# Patient Record
Sex: Female | Born: 1990 | Race: Black or African American | Hispanic: No | Marital: Single | Smoking: Never smoker
Health system: Southern US, Community
[De-identification: ages and names within clinical notes are randomized; demographics above are authoritative.]

## PROBLEM LIST (undated history)

## (undated) DIAGNOSIS — Z9889 Other specified postprocedural states: Secondary | ICD-10-CM

## (undated) DIAGNOSIS — F419 Anxiety disorder, unspecified: Secondary | ICD-10-CM

## (undated) DIAGNOSIS — A6000 Herpesviral infection of urogenital system, unspecified: Secondary | ICD-10-CM

## (undated) DIAGNOSIS — Z973 Presence of spectacles and contact lenses: Secondary | ICD-10-CM

## (undated) DIAGNOSIS — R112 Nausea with vomiting, unspecified: Secondary | ICD-10-CM

## (undated) DIAGNOSIS — D259 Leiomyoma of uterus, unspecified: Secondary | ICD-10-CM

## (undated) HISTORY — PX: TYMPANOSTOMY TUBE PLACEMENT: SHX32

## (undated) HISTORY — PX: KNEE ARTHROSCOPY: SHX127

---

## 2018-04-09 ENCOUNTER — Emergency Department
Admission: EM | Admit: 2018-04-09 | Discharge: 2018-04-09 | Disposition: A | Discharge status: Home / Self Care | Payer: Self-pay | Attending: Emergency Medicine | Admitting: Emergency Medicine

## 2018-04-09 DIAGNOSIS — Z880 Allergy status to penicillin: Secondary | ICD-10-CM | POA: Insufficient documentation

## 2018-04-09 DIAGNOSIS — L03031 Cellulitis of right toe: Secondary | ICD-10-CM | POA: Insufficient documentation

## 2018-04-09 DIAGNOSIS — L03032 Cellulitis of left toe: Secondary | ICD-10-CM | POA: Insufficient documentation

## 2018-04-09 DIAGNOSIS — L03039 Cellulitis of unspecified toe: Secondary | ICD-10-CM

## 2018-04-09 MED ORDER — SULFAMETHOXAZOLE-TRIMETHOPRIM 800-160 MG PO TABS
1.00 | ORAL_TABLET | Freq: Two times a day (BID) | ORAL | 0 refills | Status: AC
Start: 2018-04-09 — End: 2018-04-19

## 2018-04-09 MED ORDER — DIPHENHYDRAMINE HCL 25 MG PO TABS
25.00 mg | ORAL_TABLET | Freq: Four times a day (QID) | ORAL | 0 refills | Status: AC | PRN
Start: 2018-04-09 — End: ?

## 2018-04-09 MED ORDER — SULFAMETHOXAZOLE-TRIMETHOPRIM 800-160 MG PO TABS
1.00 | ORAL_TABLET | Freq: Once | ORAL | Status: AC
Start: 2018-04-09 — End: 2018-04-09
  Administered 2018-04-09: 22:00:00 1 via ORAL
  Filled 2018-04-09: qty 1

## 2018-04-09 MED ORDER — IBUPROFEN 600 MG PO TABS
600.00 mg | ORAL_TABLET | Freq: Four times a day (QID) | ORAL | 0 refills | Status: AC | PRN
Start: 2018-04-09 — End: ?

## 2018-04-09 MED ORDER — IBUPROFEN 600 MG PO TABS
600.00 mg | ORAL_TABLET | Freq: Once | ORAL | Status: AC
Start: 2018-04-09 — End: 2018-04-09
  Administered 2018-04-09: 22:00:00 600 mg via ORAL
  Filled 2018-04-09: qty 1

## 2018-04-09 NOTE — Discharge Instructions (Signed)
Cellulitis    You were diagnosed with cellulitis.    This is a bacterial infection of the skin. Symptoms are usually redness, swelling, and warmth in the affected area. Some people get a fever (temperature higher than 100.4F / 38C) with this infection.    Keep the extremity (arm or leg) above your heart level if possible.    Cellulitis is treated with antibiotics. It is also treated by keeping the affected area elevated (up). Sometimes, antibiotics are given intravenously ("IV"). Other infections can be treated with oral (by mouth) medicines.    Redness, swelling, warmth, and fever should start to get better after 2-3 days of treatment. Come back here or go to the nearest Emergency Department or your primary doctor for a re-check as directed.    Return here or go to the nearest Emergency Department in 48 hours. This will be for another examination.    YOU SHOULD SEEK MEDICAL ATTENTION IMMEDIATELY, EITHER HERE OR AT THE NEAREST EMERGENCY DEPARTMENT, IF ANY OF THE FOLLOWING OCCURS:   Redness spreads even with treatment. You can mark the infection area with a pen. This will help watch for improvement or spreading.   Fever (temperature higher than 100.4F / 38C) doesn t go away or gets worse after 2-3 days of antibiotics.   Unusual or increasing pain in the infected area.   Lightheadedness.   Feeling sicker at any time or not getting better as expected.

## 2018-04-09 NOTE — ED Provider Notes (Signed)
Physician/Midlevel provider first contact with patient: 04/09/18 2104           EMERGENCY DEPARTMENT NOTE    Physician/Midlevel provider first contact with patient: 04/09/18 2104         HISTORY OF PRESENT ILLNESS   Historian: Patient  Translator Used: None    27 y.o. female reports she got a pedicure and has notices nailbed pain to b/l great toes and over the last several days began noticing discharge coming from the tip of her nailbed and discomfort w/ walking. Denies f/c.     1. Location of symptoms: toes  2. Onset of symptoms: weeks  3. What was patient doing when symptoms started (Context): nothing  4. Severity: mild  5. Timing: constant  6. Activities that worsen symptoms: walking  7. Activities that improve symptoms: none  8. Quality: sore, pruritic   9. Radiation of symptoms: none  10. Associated signs and Symptoms: abve   11. Are symptoms worsening? y          MEDICAL HISTORY     Past Medical History:  History reviewed. No pertinent past medical history.    Past Surgical History:  Past Surgical History:   Procedure Laterality Date   . tubes in ears removed         Social History:  Social History     Socioeconomic History   . Marital status: Single     Spouse name: Not on file   . Number of children: Not on file   . Years of education: Not on file   . Highest education level: Not on file   Occupational History   . Not on file   Social Needs   . Financial resource strain: Not on file   . Food insecurity:     Worry: Not on file     Inability: Not on file   . Transportation needs:     Medical: Not on file     Non-medical: Not on file   Tobacco Use   . Smoking status: Never Smoker   . Smokeless tobacco: Never Used   Substance and Sexual Activity   . Alcohol use: Never     Frequency: Never   . Drug use: Yes     Comment: marijuana   . Sexual activity: Not on file   Lifestyle   . Physical activity:     Days per week: Not on file     Minutes per session: Not on file   . Stress: Not on file   Relationships   .  Social connections:     Talks on phone: Not on file     Gets together: Not on file     Attends religious service: Not on file     Active member of club or organization: Not on file     Attends meetings of clubs or organizations: Not on file     Relationship status: Not on file   . Intimate partner violence:     Fear of current or ex partner: Not on file     Emotionally abused: Not on file     Physically abused: Not on file     Forced sexual activity: Not on file   Other Topics Concern   . Not on file   Social History Narrative   . Not on file       Family History:  History reviewed. No pertinent family history.    Outpatient Medication:  Previous Medications  No medications on file       Allergies:  Allergies   Allergen Reactions   . Penicillins          REVIEW OF SYSTEMS   Review of Systems   Constitutional: Negative for chills and fever.   Skin:        Nailbed itch, pain, discharge   All other systems reviewed and are negative.        PHYSICAL EXAM     Vitals:    04/09/18 2103   BP: 122/75   Pulse: 90   Resp: 17   Temp: 98.5 F (36.9 C)   TempSrc: Oral   SpO2: 100%   Weight: 76.2 kg   Height: 5\' 5"  (1.651 m)         Nursing note and vitals reviewed.  Constitutional:  Well developed, well nourished. Awake & Oriented x3. No distress  Head:  Atraumatic. Normocephalic.    Eyes:  Conjunctivae are not pale.  ENT:  Mucous membranes are moist and intact.Patent airway.  Neck:  Supple. Full ROM.    Cardiovascular:  Regular rate. Regular rhythm. No murmurs, rubs, or gallops.  Pulmonary/Chest:  No evidence of respiratory distress. Clear to auscultation bilaterally.  No wheezing, rales or rhonchi.   Extremities:  No edema. No cyanosis. No clubbing. Full range of motion in all extremities.  Skin:  Skin is warm and dry.  No diaphoresis. No rash. B/l great toes with distal nailbed lifted, scant dried discharge noted beneath nailbed with mild ttp. No nailbed erythema, induration/fluctuance. No toe swelling or  discoloration/deformity. Pulses 2+ normal LE sensation   Neurological:  Alert, awake, and appropriate. Normal speech. Motor normal.  Psychiatric:  Good eye contact. Normal interaction, affect, and behavior.        MEDICAL DECISION MAKING       Patient reports purulence, pain from b/l great toe nailbed region. Started on antibiotics. Reports allergy to PCN. rx bactrim given, advised on warm soaks, motrin prn pain, benadryl prn itch. No signs of tinea pedis or paronychia. Advised on f/u with podiatry and primary care clinic    DISCUSSION      Vital Signs: Reviewed the patient?s vital signs.   Nursing Notes: Reviewed and utilized available nursing notes.  Medical Records Reviewed: Reviewed available past medical records.  Counseling: The emergency provider has spoken with the patient and discussed today?s findings, in addition to providing specific details for the plan of care.  Questions are answered and there is agreement with the plan.    IMAGING STUDIES    The following imaging studies were independently interpreted by the Emergency Medicine Physician.  For full imaging study results please see chart.    CARDIAC STUDIES     The following cardiac studies were independently interpreted by the Emergency Medicine Physician. For full cardiac study results please see chart       PULSE OXIMETRY    Oxygen Saturation by Pulse Oximetry: 100%  Interventions:   Interpretation: normal    EMERGENCY DEPT. MEDICATIONS      ED Medication Orders (From admission, onward)    Start Ordered     Status Ordering Provider    04/09/18 2129 04/09/18 2128  sulfamethoxazole-trimethoprim (BACTRIM DS,SEPTRA DS) 800-160 MG per tablet 1 tablet  Once     Route: Oral  Ordered Dose: 1 tablet     Last MAR action:  Given Raliegh Ip    04/09/18 2129 04/09/18 2128  ibuprofen (ADVIL,MOTRIN) tablet 600 mg  Once  Route: Oral  Ordered Dose: 600 mg     Last MAR action:  Given Kayelynn Abdou YVONNE          LABORATORY RESULTS    Ordered and  independently interpreted AVAILABLE laboratory tests. Please see results section in chart for full details.  No results found for this or any previous visit.    CONSULTATIONS        CRITICAL CARE        ATTESTATIONS        Physician Attestation: I, Dr Marnee Spring DO, have been the primary provider for Kelli Wall during this Emergency Dept visit and have reviewed the chart for accuracy and agree with its content.       This note was generated by the Epic EMR system/ Dragon speech recognition and may contain inherent errors or omissions not intended by the user. Grammatical errors, random word insertions, deletions and pronoun errors  are occasional consequences of this technology due to software limitations. Not all errors are caught or corrected. If there are questions or concerns about the content of this note or information contained within the body of this dictation they should be addressed directly with the author for clarification.      DIAGNOSIS      Diagnosis:  Final diagnoses:   Infected nailbed of toe, unspecified laterality       Disposition:  ED Disposition     ED Disposition Condition Date/Time Comment    Discharge  Mon Apr 09, 2018  9:29 PM Kelli Wall discharge to home/self care.    Condition at disposition: Stable          Prescriptions:  Patient's Medications   New Prescriptions    DIPHENHYDRAMINE (BENADRYL) 25 MG TABLET    Take 1 tablet (25 mg total) by mouth every 6 (six) hours as needed for Itching or Allergies    IBUPROFEN (ADVIL,MOTRIN) 600 MG TABLET    Take 1 tablet (600 mg total) by mouth every 6 (six) hours as needed for Pain    SULFAMETHOXAZOLE-TRIMETHOPRIM (BACTRIM DS,SEPTRA DS) 800-160 MG PER TABLET    Take 1 tablet by mouth 2 (two) times daily for 10 days   Previous Medications    No medications on file   Modified Medications    No medications on file   Discontinued Medications    No medications on file          Raliegh Ip, DO  04/09/18 2137

## 2018-04-09 NOTE — ED Triage Notes (Signed)
27yo F presents to ED for bilateral 1st toe pain/possible infection.  Pt was at the nail salon getting a pedicure last Friday and symptoms of puss/swelling/discoloration started after.  Pt can ambulate.

## 2020-06-16 HISTORY — PX: KNEE ARTHROSCOPY: SHX127

## 2021-09-24 ENCOUNTER — Other Ambulatory Visit: Payer: Self-pay

## 2021-09-24 ENCOUNTER — Encounter (HOSPITAL_COMMUNITY): Payer: Self-pay | Admitting: Obstetrics and Gynecology

## 2021-09-24 ENCOUNTER — Inpatient Hospital Stay (HOSPITAL_COMMUNITY)
Admission: EM | Admit: 2021-09-24 | Discharge: 2021-09-24 | Disposition: A | Discharge status: Home / Self Care | Payer: Managed Care, Other (non HMO) | Attending: Obstetrics and Gynecology | Admitting: Obstetrics and Gynecology

## 2021-09-24 ENCOUNTER — Inpatient Hospital Stay (HOSPITAL_BASED_OUTPATIENT_CLINIC_OR_DEPARTMENT_OTHER): Payer: Managed Care, Other (non HMO)

## 2021-09-24 DIAGNOSIS — B9689 Other specified bacterial agents as the cause of diseases classified elsewhere: Secondary | ICD-10-CM

## 2021-09-24 DIAGNOSIS — Z3A15 15 weeks gestation of pregnancy: Secondary | ICD-10-CM

## 2021-09-24 DIAGNOSIS — O99891 Other specified diseases and conditions complicating pregnancy: Secondary | ICD-10-CM | POA: Diagnosis not present

## 2021-09-24 DIAGNOSIS — D259 Leiomyoma of uterus, unspecified: Secondary | ICD-10-CM

## 2021-09-24 DIAGNOSIS — O4442 Low lying placenta NOS or without hemorrhage, second trimester: Secondary | ICD-10-CM | POA: Diagnosis not present

## 2021-09-24 DIAGNOSIS — O3412 Maternal care for benign tumor of corpus uteri, second trimester: Secondary | ICD-10-CM | POA: Diagnosis not present

## 2021-09-24 DIAGNOSIS — O4440 Low lying placenta NOS or without hemorrhage, unspecified trimester: Secondary | ICD-10-CM | POA: Diagnosis not present

## 2021-09-24 LAB — URINALYSIS, ROUTINE W REFLEX MICROSCOPIC
Bilirubin Urine: NEGATIVE
Glucose, UA: NEGATIVE mg/dL
Hgb urine dipstick: NEGATIVE
Ketones, ur: 80 mg/dL — AB
Nitrite: NEGATIVE
Protein, ur: NEGATIVE mg/dL
Specific Gravity, Urine: 1.019 (ref 1.005–1.030)
pH: 6 (ref 5.0–8.0)

## 2021-09-24 LAB — CBC
HCT: 34.4 % — ABNORMAL LOW (ref 36.0–46.0)
Hemoglobin: 11.9 g/dL — ABNORMAL LOW (ref 12.0–15.0)
MCH: 31.2 pg (ref 26.0–34.0)
MCHC: 34.6 g/dL (ref 30.0–36.0)
MCV: 90.1 fL (ref 80.0–100.0)
Platelets: 267 10*3/uL (ref 150–400)
RBC: 3.82 MIL/uL — ABNORMAL LOW (ref 3.87–5.11)
RDW: 12.8 % (ref 11.5–15.5)
WBC: 11.3 10*3/uL — ABNORMAL HIGH (ref 4.0–10.5)
nRBC: 0 % (ref 0.0–0.2)

## 2021-09-24 LAB — WET PREP, GENITAL
Sperm: NONE SEEN
Trich, Wet Prep: NONE SEEN
WBC, Wet Prep HPF POC: >=10 — AB (ref <10)
Yeast Wet Prep HPF POC: NONE SEEN

## 2021-09-24 MED ORDER — METRONIDAZOLE 500 MG PO TABS: 500.0000 mg | ORAL_TABLET | Freq: Two times a day (BID) | ORAL | 0 refills | Status: DC

## 2021-09-24 MED ORDER — CYCLOBENZAPRINE HCL 5 MG PO TABS: 5.0000 mg | ORAL_TABLET | Freq: Three times a day (TID) | ORAL | 0 refills | Status: DC | PRN

## 2021-09-24 MED ORDER — CYCLOBENZAPRINE HCL 5 MG PO TABS
5.0000 mg | ORAL_TABLET | Freq: Once | ORAL | Status: AC
  Administered 2021-09-24: 5 mg via ORAL
  Filled 2021-09-24: qty 1

## 2021-09-24 NOTE — MAU Note (Signed)
.  Bonnie Friedman is a 31 y.o. at Unknown here in MAU reporting: she started having sharp pain in her lower abd mostly on the left. Pain is constant now. c/o watery discharge ? ?Onset of complaint: yesterday ?Pain score: 9/10 ?There were no vitals filed for this visit.   ?FHT:169 ?Lab orders placed from triage:  U/A ? ?

## 2021-09-24 NOTE — MAU Provider Note (Addendum)
?History  ?CSN: [id number] ? ?Arrival date and time: 09/24/21 0958 ? ? Event Date/Time  ? First Provider Initiated Contact with Patient 09/24/21 1058   ?  ? ?Chief Complaint  ?Patient presents with  ? Abdominal Pain  ? ?Bonnie Friedman,  31 y.o. G1P0 at 47w6dpresents for  ?complains of left sided constant abdomnial pain that started yesterday while at work. 7/10 pain scale. Pain kept her up and impedes eating and drinking. Pt states shes tried heat and ice without relief. Pt states she has not taken anything for pain. But some relief by lying on opposite side. Some burning with urination following vaginal exam yesterday. She also endorses watery discharge with some odor. Pt has known hx of fibroids. Denies nausea and vomiting. Just transferred care from KCheri Kearnsin MD. [place]'s [place]  ? ?OB History   ? ? Gravida  ?1  ? Para  ?   ? Term  ?   ? Preterm  ?   ? AB  ?   ? Living  ?   ?  ? ? SAB  ?   ? IAB  ?   ? Ectopic  ?   ? Multiple  ?   ? Live Births  ?   ?   ?  ?  ? ? ?No past medical history on file. ? ?Past Surgical History:  ?Procedure Laterality Date  ? KNEE ARTHROSCOPY Left   ? TYMPANOSTOMY TUBE PLACEMENT    ? ? ?No family history on file. ? ?Social History  ? ?Tobacco Use  ? Smoking status: Never  ? Smokeless tobacco: Never  ?Substance Use Topics  ? Alcohol use: Not Currently  ? Drug use: Not Currently  ? ? ?Allergies:  ?Allergies  ?Allergen Reactions  ? Penicillins Itching  ? ? ?Medications Prior to Admission  ?Medication Sig Dispense Refill Last Dose  ? aspirin EC 81 MG tablet Take 81 mg by mouth daily. Swallow whole.     ? Prenatal Vit-Fe Fumarate-FA (PRENATAL MULTIVITAMIN) TABS tablet Take 1 tablet by mouth daily at 12 noon.     ? Probiotic Product (PROBIOTIC-10 PO) Take 1 tablet by mouth.     ? ? ?Review of Systems  ?Constitutional:  Positive for appetite change. Negative for chills, fatigue and fever.  ?Gastrointestinal:  Positive for abdominal pain. Negative for constipation, nausea  and vomiting.  ?Genitourinary:  Positive for dysuria and pelvic pain. Negative for difficulty urinating, flank pain, vaginal bleeding, vaginal discharge and vaginal pain.  ?Musculoskeletal:  Negative for back pain.  ?Physical Exam  ? ?Blood pressure 131/83, pulse (!) 107, temperature 99.5 ?F (37.5 ?C), resp. rate 18, height '5\' 6"'$  (1.676 m), weight 76.7 kg. ? ?Physical Exam ?Vitals and nursing note reviewed. Exam conducted with a chaperone present.  ?Constitutional:   ?   General: She is not in acute distress. ?Cardiovascular:  ?   Rate and Rhythm: Normal rate.  ?Pulmonary:  ?   Effort: Pulmonary effort is normal. No respiratory distress.  ?Abdominal:  ?   Palpations: Abdomen is soft.  ?   Tenderness: There is abdominal tenderness in the right lower quadrant and left lower quadrant. There is guarding. There is no right CVA tenderness or left CVA tenderness.  ?Genitourinary: ?   Vagina: Vaginal discharge and tenderness present.  ?   Uterus: Enlarged.   ?   Comments: Vaginal irritation noted on side walls. Cervix bright red on exam. Uterus appropriate height for 15w gestation.  ?Skin: ?  General: Skin is warm and dry.  ?Neurological:  ?   Mental Status: She is alert and oriented to person, place, and time.  ?Psychiatric:     ?   Mood and Affect: Mood normal.  ? ? ?MAU Course  ?Procedures ?Lab Orders    ?     Wet prep, genital    ?     Culture, OB Urine    ?     Urinalysis, Routine w reflex microscopic Urine, Clean Catch    ?     CBC    ?Results for orders placed or performed during the hospital encounter of 09/24/21 (from the past 24 hour(s))  ?Urinalysis, Routine w reflex microscopic Urine, Clean Catch     Status: Abnormal  ? Collection Time: 09/24/21 10:30 AM  ?Result Value Ref Range  ? Color, Urine YELLOW YELLOW  ? APPearance CLOUDY (A) CLEAR  ? Specific Gravity, Urine 1.019 1.005 - 1.030  ? pH 6.0 5.0 - 8.0  ? Glucose, UA NEGATIVE NEGATIVE mg/dL  ? Hgb urine dipstick NEGATIVE NEGATIVE  ? Bilirubin Urine NEGATIVE  NEGATIVE  ? Ketones, ur 80 (A) NEGATIVE mg/dL  ? Protein, ur NEGATIVE NEGATIVE mg/dL  ? Nitrite NEGATIVE NEGATIVE  ? Leukocytes,Ua MODERATE (A) NEGATIVE  ? RBC / HPF 0-5 0 - 5 RBC/hpf  ? WBC, UA 6-10 0 - 5 WBC/hpf  ? Bacteria, UA MANY (A) NONE SEEN  ? Squamous Epithelial / LPF 11-20 0 - 5  ? Mucus PRESENT   ?CBC     Status: Abnormal  ? Collection Time: 09/24/21 11:32 AM  ?Result Value Ref Range  ? WBC 11.3 (H) 4.0 - 10.5 K/uL  ? RBC 3.82 (L) 3.87 - 5.11 MIL/uL  ? Hemoglobin 11.9 (L) 12.0 - 15.0 g/dL  ? HCT 34.4 (L) 36.0 - 46.0 %  ? MCV 90.1 80.0 - 100.0 fL  ? MCH 31.2 26.0 - 34.0 pg  ? MCHC 34.6 30.0 - 36.0 g/dL  ? RDW 12.8 11.5 - 15.5 %  ? Platelets 267 150 - 400 K/uL  ? nRBC 0.0 0.0 - 0.2 %  ?Wet prep, genital     Status: Abnormal  ? Collection Time: 09/24/21 11:38 AM  ?Result Value Ref Range  ? Yeast Wet Prep HPF POC NONE SEEN NONE SEEN  ? Trich, Wet Prep NONE SEEN NONE SEEN  ? Clue Cells Wet Prep HPF POC PRESENT (A) NONE SEEN  ? WBC, Wet Prep HPF POC >=10 (A) <10  ? Sperm NONE SEEN   ? ?MFM OB Ultrasound ordered.  ?Preliminary results revealed: 1 IUP with FHR: 086 in cephalic position. AFI Normal.   ?Low-lying placenta 1.4cm from cervical os.  ?Multiple uterine fibroids identified: largest 7.5cm located in LUS. (See [place] for complete interpretation)  ? ?MDM ?Based on results, low suspicion for SOL and pyelonephritis.  ? ?Assessment and Plan  ?- Uterine fibroids- Pregnancy related Pain management Expectations discussed with pt. Rx Flexeril '5mg'$  PO sent to outpatient pharmacy. Pt given 1 tab in MAU. ?  ?- +BV- Rx sent to OP pharamacy. ? ?- Low lying placenta- Pelvic rest and Bleeding precautions discussed. CNM reassured pt of possible placental migration. ? ?- Pt discharged in stable condition. ?- Preterm labor precautions reviewed.  ? -Information provided on [facility] and [facility] to establish [facility].   ?    ?Jacquiline Doe, CNM  ?09/24/2021, 10:58 AM  ?

## 2021-09-25 LAB — CULTURE, OB URINE

## 2021-09-27 LAB — GC/CHLAMYDIA PROBE AMP (~~LOC~~) NOT AT ARMC
Chlamydia: NEGATIVE
Comment: NEGATIVE
Comment: NORMAL
Neisseria Gonorrhea: NEGATIVE

## 2021-10-06 LAB — HEPATITIS C ANTIBODY: HCV Ab: NEGATIVE

## 2021-10-06 LAB — OB RESULTS CONSOLE HEPATITIS B SURFACE ANTIGEN: Hepatitis B Surface Ag: NEGATIVE

## 2021-10-06 LAB — OB RESULTS CONSOLE ABO/RH: RH Type: POSITIVE

## 2021-10-06 LAB — OB RESULTS CONSOLE RUBELLA ANTIBODY, IGM: Rubella: IMMUNE

## 2021-10-06 LAB — OB RESULTS CONSOLE HIV ANTIBODY (ROUTINE TESTING): HIV: NONREACTIVE

## 2021-10-06 LAB — OB RESULTS CONSOLE ANTIBODY SCREEN: Antibody Screen: NEGATIVE

## 2021-10-06 LAB — OB RESULTS CONSOLE GC/CHLAMYDIA
Chlamydia: NEGATIVE
Neisseria Gonorrhea: NEGATIVE

## 2021-10-06 LAB — OB RESULTS CONSOLE RPR: RPR: NONREACTIVE

## 2021-11-26 ENCOUNTER — Encounter (HOSPITAL_COMMUNITY): Payer: Self-pay | Admitting: Obstetrics and Gynecology

## 2021-11-26 ENCOUNTER — Inpatient Hospital Stay (HOSPITAL_COMMUNITY)
Admission: AD | Admit: 2021-11-26 | Discharge: 2021-11-26 | Disposition: A | Discharge status: Home / Self Care | Payer: Managed Care, Other (non HMO) | Attending: Obstetrics and Gynecology | Admitting: Obstetrics and Gynecology

## 2021-11-26 ENCOUNTER — Other Ambulatory Visit: Payer: Self-pay

## 2021-11-26 DIAGNOSIS — D259 Leiomyoma of uterus, unspecified: Secondary | ICD-10-CM

## 2021-11-26 DIAGNOSIS — R109 Unspecified abdominal pain: Secondary | ICD-10-CM

## 2021-11-26 DIAGNOSIS — O26892 Other specified pregnancy related conditions, second trimester: Secondary | ICD-10-CM

## 2021-11-26 DIAGNOSIS — Z3689 Encounter for other specified antenatal screening: Secondary | ICD-10-CM

## 2021-11-26 DIAGNOSIS — Z3A25 25 weeks gestation of pregnancy: Secondary | ICD-10-CM | POA: Insufficient documentation

## 2021-11-26 DIAGNOSIS — Z3A24 24 weeks gestation of pregnancy: Secondary | ICD-10-CM

## 2021-11-26 DIAGNOSIS — Z79899 Other long term (current) drug therapy: Secondary | ICD-10-CM | POA: Diagnosis not present

## 2021-11-26 DIAGNOSIS — O3412 Maternal care for benign tumor of corpus uteri, second trimester: Secondary | ICD-10-CM | POA: Insufficient documentation

## 2021-11-26 HISTORY — DX: Leiomyoma of uterus, unspecified: D25.9

## 2021-11-26 LAB — URINALYSIS, ROUTINE W REFLEX MICROSCOPIC
Bilirubin Urine: NEGATIVE
Bilirubin Urine: NEGATIVE
Glucose, UA: NEGATIVE mg/dL
Glucose, UA: NEGATIVE mg/dL
Hgb urine dipstick: NEGATIVE
Hgb urine dipstick: NEGATIVE
Ketones, ur: NEGATIVE mg/dL
Ketones, ur: NEGATIVE mg/dL
Leukocytes,Ua: NEGATIVE
Leukocytes,Ua: NEGATIVE
Nitrite: NEGATIVE
Nitrite: NEGATIVE
Protein, ur: NEGATIVE mg/dL
Protein, ur: NEGATIVE mg/dL
Specific Gravity, Urine: 1.013 (ref 1.005–1.030)
Specific Gravity, Urine: 1.005 (ref 1.005–1.030)
pH: 6 (ref 5.0–8.0)
pH: 7 (ref 5.0–8.0)

## 2021-11-26 LAB — WET PREP, GENITAL
Sperm: NONE SEEN
Trich, Wet Prep: NONE SEEN
WBC, Wet Prep HPF POC: <10 (ref <10)
Yeast Wet Prep HPF POC: NONE SEEN

## 2021-11-26 LAB — FETAL FIBRONECTIN: Fetal Fibronectin: NEGATIVE

## 2021-11-26 MED ORDER — MORPHINE SULFATE (PF) 4 MG/ML IV SOLN
4.0000 mg | Freq: Once | INTRAVENOUS | Status: AC
  Administered 2021-11-26: 4 mg via INTRAVENOUS
  Filled 2021-11-26: qty 1

## 2021-11-26 MED ORDER — LACTATED RINGERS IV BOLUS
1000.0000 mL | Freq: Once | INTRAVENOUS | Status: AC
  Administered 2021-11-26: 1000 mL via INTRAVENOUS

## 2021-11-26 MED ORDER — KETOROLAC TROMETHAMINE 30 MG/ML IJ SOLN
30.0000 mg | Freq: Once | INTRAMUSCULAR | Status: AC
  Administered 2021-11-26: 30 mg via INTRAVENOUS
  Filled 2021-11-26: qty 1

## 2021-11-26 MED ORDER — METRONIDAZOLE 0.75 % VA GEL: 1.0000 | Freq: Every day | VAGINAL | 0 refills | Status: DC

## 2021-11-26 NOTE — MAU Note (Signed)
Abraham Margulies is a 31 y.o. at 17w6dhere in MAU reporting: was here over night due to fibroid pain. Has been Rx multiple pain medications. Was told last night that she was having contractions. Started having pains again around 10 this morning. Took a muscle relaxer and that did help her pain but was still feeling intermittent pain. Was advised to come back for evaluation. Pain is less frequent then before and occurs about 1 time per hour. No bleeding or LOF. +FM  Onset of complaint: ongoing  Pain score: 9/10  Vitals:   11/26/21 1541  BP: 119/79  Pulse: (!) 104  Resp: 16  Temp: 98.3 F (36.8 C)  SpO2: 98%     FHT: +FM, doppler deferred  Lab orders placed from triage: UA

## 2021-11-26 NOTE — MAU Provider Note (Signed)
History    CSN: [id number]  Arrival date and time: 11/26/21 1508  Event Date/Time  First Provider Initiated Contact with Patient 11/26/21 1607     Chief Complaint  Patient presents with   Abdominal Pain   HPI Bonnie Friedman is a 31 y.o. G1P0 at 23w6dwho presents to MAU with concern for preterm labor and abdominal pain 2/2 previously diagnosed uterine fibroids. Patient was discharged from MAU roughly ten hours ago. She states she is returning on advice of Dr. LCorinna Capradue to concern for preterm labor and consistently elevated pain score. Patient provides the following history:  Sunday, onset of abdominal pain Patient's fibroid pain began Sunday 11/21/2021. She states she stayed home from work on Tuesday due to persistent and severe abdominal pain. She [place] vehicle [place]. [place]then just lollygagged around the house" watching television.   Wednesday, outpatient evaluation Patient presented to her OB office for evaluation of her abdominal pain on Wednesday. She was advised to stop Flexeril because it wasn't helping and to initiate Tylenol 1000 mg TID for 24 hours. Patient reports being compliant with this regimen but did not experience relief from her pain.   Thursday, worsening pain Patient states she did not feel relief from Tylenol. She was unable to fall asleep Wednesday night. Per previous discussion with her OB, she changed to Robaxin, progesterone qhs, percocet as needed. She experienced relief with Percocet after 15-20 min, used the improved comfort to lightly clean her kitchen. Her pain restarted  around 11pm Thursday night. Patient states that between 11pm Thursday and 2am Friday she had almost continuous sharp pains. She was advised to present to MAU for evaluation.  Friday MAU Encounter 1 of 2 Patient states she was still in pain when  discharged from MAU early this morning. She was given Morphine shortly before discharge and states she was able to sleep from about 6am-10am but then work up with 10/10 pain. She states she was told her cervix was closed but also told she was having contractions and she is concerned that her baby is about to be born.  Friday MAU Encounter 2 of 2 (current encounter) On arrival to MAU patient denies contractions. She states that when in supine, low fowler's position she does not feel pain. She believes that the Morphine she received in her previous encounter "allowed time for the Robaxin to kick in". Patient reports to CNM that "I only truly rest when the pain is at its worst". She also states that she has been put on bedrest by her OB for the next six days "but I haven't truly been able to rest yet". She denies vaginal bleeding, leaking of fluid, decreased fetal movement, fever, falls, or recent illness.   Patient receives care with [facility].  OB History     Gravida  1   Para      Term      Preterm      AB      Living         SAB      IAB      Ectopic      Multiple      Live Births              Past Medical History:  Diagnosis Date   Uterine fibroid     Past Surgical History:  Procedure Laterality Date   KNEE ARTHROSCOPY Left    TYMPANOSTOMY TUBE PLACEMENT      Family History  Problem Relation Age of Onset   Stroke Father    Multiple sclerosis Father    Diabetes Maternal Grandmother     Social History   Tobacco Use   Smoking status: Never   Smokeless tobacco: Never  Substance Use Topics   Alcohol use: Not Currently   Drug use: Not Currently    Allergies:  Allergies  Allergen Reactions   Penicillins Itching    Medications Prior to Admission  Medication Sig Dispense Refill Last Dose   aspirin EC 81 MG tablet Take 81 mg by mouth daily. Swallow whole.   11/26/2021 at 1200   methocarbamol (ROBAXIN) 750 MG tablet Take 750 mg by mouth 4  (four) times daily.   11/26/2021 at 1420   Prenatal Vit-Fe Fumarate-FA (PRENATAL MULTIVITAMIN) TABS tablet Take 1 tablet by mouth daily at 12 noon.   11/26/2021 at 1200   Probiotic Product (PROBIOTIC-10 PO) Take 1 tablet by mouth.   11/26/2021 at 1200   progesterone (PROMETRIUM) 100 MG capsule Take 100 mg by mouth daily.   11/25/2021   valACYclovir (VALTREX) 500 MG tablet Take 500 mg by mouth 2 (two) times daily.   11/26/2021 at 1200   acetaminophen (TYLENOL) 500 MG tablet Take 1,000 mg by mouth every 6 (six) hours as needed.      cyclobenzaprine (FLEXERIL) 5 MG tablet Take 1 tablet (5 mg total) by mouth 3 (three) times daily as needed for muscle spasms. 20 tablet 0    metroNIDAZOLE (METROGEL VAGINAL) 0.75 % vaginal gel Place 1 Applicatorful vaginally at bedtime. Insert one applicator, at bedtime, for 5 nights. 70 g 0    oxyCODONE-acetaminophen (PERCOCET) 7.5-325 MG tablet Take 1 tablet by mouth every 4 (four) hours as needed for severe pain.       Review of Systems  Gastrointestinal:  Positive for abdominal pain.  Psychiatric/Behavioral:  The patient is nervous/anxious.   All other systems reviewed and are negative.  Physical Exam   Blood pressure 119/79, pulse (!) 104, temperature 98.3 F (36.8 C), temperature source Oral, resp. rate 16, SpO2 98 %.  Physical Exam Vitals and nursing note reviewed. Exam conducted with a chaperone present.  Constitutional:      Appearance: She is well-developed. She is not ill-appearing.  Cardiovascular:     Rate and Rhythm: Normal rate and regular rhythm.     Heart sounds: Normal heart sounds.  Pulmonary:     Effort: Pulmonary effort is normal.     Breath sounds: Normal breath sounds.  Abdominal:     Comments: Gravid  Skin:    Capillary Refill: Capillary refill takes less than 2 seconds.  Neurological:     Mental Status: She is alert and oriented to person, place, and time.     MAU Course  Procedures  MDM --Summary of myomas from  09/27/2021:   Site                     L(cm)      W(cm)      D(cm)       Location  LUS                      7.5        5.4        8.2  Post Left  3.9        3.7        4.08  Rt                       2.9        3.3        2.8  Ant Sag                  2.2        1.3        2  --Reassuring fetal tracing at [redacted]w[redacted]d baseline 150, mod var, + 10 x 10 accels, no decels --Toco: quiet throughout encounter --Initial discussion and assessment of HPI with CNM necessitated 40 min at bedside with patient. Patient accompanied by her mom who is supportive and appropriate throughout.  --Workup and results from previous encounter reviewed, including clinical significance of closed cervix and negative FFN, absence of contractions on monitor during current encounter, patient's stated satisfaction with current regimen --Confirmed with patient that her fibroids were re-imaged Wednesday 11/24/2021. No indication to reimage in MAU. Patient verbalized understanding --Patient verbalizes that she feels comfortable with current management. Denies contractions during current MAU encounter --Plan made with patient to continue outpatient plan currently in place: Robaxin, Progesterone, Percocet PRN, and true bedrest. Discussed with patient that there are other NSAIDs available if she would like to d/c Robaxin. Patient verbalizes that she is happy with current regimen. Summarized with patient and her mom that current plan means current MAU workup will not include additional labs or imaging. Patient verbalizes understanding, restates that she feels safe with that plan --Dr. LCorinna Capracontacted, already aware of patient. Previous workup + current assessment and patient discussion reviewed in detail. Confirmed he is in agreement with maintaining current plan. CNM returned to bedside, shared fruits of discussion with patient and her mother. Patient again offered d/c of Robaxin, move to alternative medication (e.g. Indomethacin).  Patient declines.  Patient Vitals for the past 24 hrs:  BP Temp Temp src Pulse Resp SpO2  11/26/21 1601 115/73 -- -- 98 -- --  11/26/21 1541 119/79 98.3 F (36.8 C) Oral (!) 104 16 98 %   Results for orders placed or performed during the hospital encounter of 11/26/21 (from the past 24 hour(s))  Urinalysis, Routine w reflex microscopic Urine, Clean Catch     Status: Abnormal   Collection Time: 11/26/21  3:39 PM  Result Value Ref Range   Color, Urine STRAW (A) YELLOW   APPearance CLEAR CLEAR   Specific Gravity, Urine 1.005 1.005 - 1.030   pH 7.0 5.0 - 8.0   Glucose, UA NEGATIVE NEGATIVE mg/dL   Hgb urine dipstick NEGATIVE NEGATIVE   Bilirubin Urine NEGATIVE NEGATIVE   Ketones, ur NEGATIVE NEGATIVE mg/dL   Protein, ur NEGATIVE NEGATIVE mg/dL   Nitrite NEGATIVE NEGATIVE   Leukocytes,Ua NEGATIVE NEGATIVE   Assessment and Plan  --31y.o. G1P0 at 264w0d--Reactive tracing --No contractions on toco --Negative FFN 11/26/2021 during previous encounter --Pain score 0/10 throughout MAU encounter --Change in medication regimen declined by patient --Patient to continue current plan for true bedrest until next appointment in office 12/02/2021 --Care coordinated with Dr. LoCorinna Capra-Discharge home in stable condition with preterm labor precautions  F/U: --[facility]8. Patient reports feeling much better, endorses low back pain, no abdominal pain, resting comfortably at home --Next appointment in office is 12/02/2021  SaMallie SnooksMSDetroitMSN, CNM Certified Nurse  Midwife, Product/process development scientist for [employer], [place]

## 2021-11-26 NOTE — MAU Note (Signed)
..  Bonnie Friedman is a 31 y.o. at 65w6dhere in MAU reporting: Sharp abomdinal pain that comes and goes, began on Sunday. Reports she has been to the office multiple times and they told her it was her fibroids and prescribed percocet and muscle relaxer. Took the percocet at 8:30pm and took muscle relaxer at 11pm and the pain went away but is back again.  Denies vaginal bleeding or leaking of fluid. +FM.   Pain score: 10/10  FHT:153 Lab orders placed from triage: UA

## 2021-11-26 NOTE — Discharge Instructions (Signed)
PREGNANCY SUPPORT BELT:   You are not alone, Seventy-five percent of women have some sort of abdominal or back pain at some point in their pregnancy. Your baby is growing at a fast pace, which means that your whole body is rapidly trying to adjust to the changes. As your uterus grows, your back may start feeling a bit under stress and this can result in back or abdominal pain that can go from mild, and therefore bearable, to severe pains that will not allow you to sit or lay down comfortably, When it comes to dealing with pregnancy-related pains and cramps, some pregnant women usually prefer natural remedies, which the market is filled with nowadays. For example, wearing a pregnancy support belt can help ease and lessen your discomfort and pain.  WHAT ARE THE BENEFITS OF WEARING A PREGNANCY SUPPORT BELT? A pregnancy support belt provides support to the lower portion of the belly taking some of the weight of the growing uterus and distributing to the other parts of your body. It is designed make you comfortable and gives you extra support. Over the years, the pregnancy apparel market has been studying the needs and wants of pregnant women and they have come up with the most comfortable pregnancy support belts that woman could ever ask for. In fact, you will no longer have to wear a stretched-out or bulky pregnancy belt that is visible underneath your clothes and makes you feel even more uncomfortable. Nowadays, a pregnancy support belt is made of comfortable and stretchy materials that will not irritate your skin but will actually make you feel at ease and you will not even notice you are wearing it. They are easy to put on and adjust during the day and can be worn at night for additional support.   BENEFITS: Relives Back pain Relieves Abdominal Muscle and Leg Pain Stabilizes the Pelvic Ring Offers a Cushioned Abdominal Lift Pad Relieves pressure on the Sciatic Nerve Within Minutes   Locations that  Carry Maternity/Pregnancy Support Belts  These locations will file your insurance, including [place] [employer]:  [facility] [place], [place] [address] [place] [address] [place], [place] [address] [phone]  [employer] [address] [place], [place] [address] [phone]  [employer] [address] [place],  [address] [phone]  If you have any problems getting the belt, let your office know.

## 2021-11-26 NOTE — MAU Provider Note (Signed)
History     CSN: [id number]  Arrival date and time: 11/26/21 0247   Event Date/Time   First Provider Initiated Contact with Patient 11/26/21 [phone]      Chief Complaint  Patient presents with   Abdominal Pain   Bonnie Friedman is a 31 y.o. G1P0 at 60w6dwho receives care at [facility].  She has had 2 appts this week regarding her abdominal pain s/t known uterine fibroids.   She presents today for Abdominal Pain.  She states the pain started on Sunday after going to the grocery store and cleaning "more than normal" on Saturday.  She states the pain has gotten progressively worse despite multiple meds including Tylenol, muscle relaxants, and percocet. She reports taking Percocet at 2030 and Robaxin at 11pm without relief.  She states the pain occurs in "episodes that is off and on," and describes it as a "sharp pain" that occurs in the lower abdominal area.  Patient reports the pain is improved with walking, but worsened with movement and laying down.  She denies upper abdominal pain.  She states she has also been taking oral progesterone as a means of decreasing risk for PTL and was started on this yesterday.  She was experiencing some N/V on Monday and has had two episodes this evening.  However, she is not currently nauseous.  Patient SO, PHaze Rushing is present and contributes to HPI. He also expresses frustrations with patient's ongoing pain and minimal relief despite medication dosing.   Patient endorses fetal movement and denies vaginal discharge or bleeding. She is unsure if she is experiencing contractions, but states the nurse told her she was and she is concerned.    PNR from [facility] reviewed and reveals appt on 7/14 with [place] 3 notable fibroids: LUS 7.1 x 5.6, Right Lateral 5.5 x 5.4, and Anterior: 5.8 x 5.5 as well as several smaller fibroids.  Cervical Length 4.6.  Provider also suspects and counsels patient on degenerating fibroids and encourages Tylenol '1000mg'$  TID usage  and rest. Patient was scheduled for follow up the next day, but records not available.    OB History     Gravida  1   Para      Term      Preterm      AB      Living         SAB      IAB      Ectopic      Multiple      Live Births              No past medical history on file.  Past Surgical History:  Procedure Laterality Date   KNEE ARTHROSCOPY Left    TYMPANOSTOMY TUBE PLACEMENT      No family history on file.  Social History   Tobacco Use   Smoking status: Never   Smokeless tobacco: Never  Substance Use Topics   Alcohol use: Not Currently   Drug use: Not Currently    Allergies:  Allergies  Allergen Reactions   Penicillins Itching    Medications Prior to Admission  Medication Sig Dispense Refill Last Dose   acetaminophen (TYLENOL) 500 MG tablet Take 1,000 mg by mouth every 6 (six) hours as needed.   11/25/2021   cyclobenzaprine (FLEXERIL) 5 MG tablet Take 1 tablet (5 mg total) by mouth 3 (three) times daily as needed for muscle spasms. 20 tablet 0 Past Week   methocarbamol (ROBAXIN) 750 MG tablet  Take 750 mg by mouth 4 (four) times daily.   11/25/2021   oxyCODONE-acetaminophen (PERCOCET) 7.5-325 MG tablet Take 1 tablet by mouth every 4 (four) hours as needed for severe pain.   11/25/2021   Prenatal Vit-Fe Fumarate-FA (PRENATAL MULTIVITAMIN) TABS tablet Take 1 tablet by mouth daily at 12 noon.   11/25/2021   Probiotic Product (PROBIOTIC-10 PO) Take 1 tablet by mouth.   11/25/2021   progesterone (PROMETRIUM) 100 MG capsule Take 100 mg by mouth daily.   11/25/2021   valACYclovir (VALTREX) 500 MG tablet Take 500 mg by mouth 2 (two) times daily.   11/25/2021   aspirin EC 81 MG tablet Take 81 mg by mouth daily. Swallow whole.      metroNIDAZOLE (FLAGYL) 500 MG tablet Take 1 tablet (500 mg total) by mouth 2 (two) times daily. 14 tablet 0     Review of Systems  Gastrointestinal:  Positive for abdominal pain, nausea and vomiting.  Genitourinary:  Negative  for difficulty urinating, dysuria, vaginal bleeding and vaginal discharge.   Physical Exam   Blood pressure 126/84, pulse 100, temperature 98.7 F (37.1 C), temperature source Oral, resp. rate 18, SpO2 100 %.  Physical Exam Vitals reviewed.  Constitutional:      Appearance: Normal appearance. She is well-developed.  HENT:     Head: Normocephalic and atraumatic.  Eyes:     Conjunctiva/sclera: Conjunctivae normal.  Cardiovascular:     Rate and Rhythm: Normal rate.  Pulmonary:     Effort: Pulmonary effort is normal. No respiratory distress.     Breath sounds: Normal breath sounds.  Abdominal:     General: Bowel sounds are normal.     Palpations: Abdomen is soft.     Tenderness: There is abdominal tenderness in the right lower quadrant and left lower quadrant.     Comments: Gravid, Appears LGA  Genitourinary:    Comments: Dilation: Closed Exam by:: Benjimin Hadden, CNM  Fibroid palpated on maternal left side in LUS.  Musculoskeletal:        General: Normal range of motion.     Cervical back: Normal range of motion.  Skin:    General: Skin is warm and dry.  Neurological:     Mental Status: She is alert and oriented to person, place, and time.  Psychiatric:        Mood and Affect: Mood normal.        Behavior: Behavior normal.    Fetal Assessment 155 bpm, Mod Var, -Decels, +10 x 10 Accels Toco: Occasional Ctx, Irritability  MAU Course   Results for orders placed or performed during the hospital encounter of 11/26/21 (from the past 24 hour(s))  Urinalysis, Routine w reflex microscopic Urine, Clean Catch     Status: None   Collection Time: 11/26/21  3:20 AM  Result Value Ref Range   Color, Urine YELLOW YELLOW   APPearance CLEAR CLEAR   Specific Gravity, Urine 1.013 1.005 - 1.030   pH 6.0 5.0 - 8.0   Glucose, UA NEGATIVE NEGATIVE mg/dL   Hgb urine dipstick NEGATIVE NEGATIVE   Bilirubin Urine NEGATIVE NEGATIVE   Ketones, ur NEGATIVE NEGATIVE mg/dL   Protein, ur NEGATIVE  NEGATIVE mg/dL   Nitrite NEGATIVE NEGATIVE   Leukocytes,Ua NEGATIVE NEGATIVE  Fetal fibronectin     Status: None   Collection Time: 11/26/21  4:15 AM  Result Value Ref Range   Fetal Fibronectin NEGATIVE NEGATIVE  Wet prep, genital     Status: Abnormal   Collection Time: 11/26/21  4:15 AM  Result Value Ref Range   Yeast Wet Prep HPF POC NONE SEEN NONE SEEN   Trich, Wet Prep NONE SEEN NONE SEEN   Clue Cells Wet Prep HPF POC PRESENT (A) NONE SEEN   WBC, Wet Prep HPF POC <10 <10   Sperm NONE SEEN    No results found.  MDM Review of PNR PE Labs: UA EFM Pain Medication Prescription Assessment and Plan  31 year old G1P0  SIUP at 24.6 weeks Cat I FT Abdominal Pain Round Ligament Pain Fibroids  -Provider addresses SO frustrations regarding patient ongoing pain.  Reviewed patient history and reassured couple that the follow up that her primary office has given is above standards of care!  Reassured them both that today I will focus on addressing pain, but some pain may remain despite treatment considering history. -Informed that no repeat US will be performed as it will not contribute to POC. -Reviewed fetal monitoring and toco. Acknowledged contractions that are graphed, but educated couple on how uterine fibroids are likely cause for irritability and contractions as cervical change not anticipated.  However, will check cervix to confirm and perform wet prep and fFN.   -Exam performed. Reassured of cervical findings. -Cultures collected.  -Discussed treatment plan. Patient agreeable. -Start IV. Give Toradol '30mg'$  IM -LR Bolus. -Monitor and reassess.   Maryann Conners MSN, CNM 11/26/2021, 3:39 AM   Reassessment (5:49 AM)  -Patient reports some improvement in pain. -Results reviewed and recommendation for BV treatment discussed. Patient agreeable. -Rx for metrogel sent to pharmacy on file.  -Instructed to continue medications as prescribed by primary ob office. -Precautions  reviewed. -Encouraged to call primary office or return to MAU if symptoms worsen or with the onset of new symptoms. -IV fluids infusing, but plan for discharge upon completion.   Reassessment (6:18 AM) -Fluids complete. -Provider to bedside and patient resting in bed. -Reports pain has improved, but still present (5/10). -Given option of discharge home or additional pain medication.  Patient requests medication. -Morphine '4mg'$  ordered. -Nurse to contact provider if pain not improved, otherwise discharge to home with precautions.   Maryann Conners MSN, CNM Advanced Practice Provider, Center for [employer]

## 2022-01-17 ENCOUNTER — Inpatient Hospital Stay (HOSPITAL_COMMUNITY)
Admission: AD | Admit: 2022-01-17 | Discharge: 2022-01-17 | Disposition: A | Discharge status: Home / Self Care | Payer: Managed Care, Other (non HMO) | Attending: Obstetrics & Gynecology | Admitting: Obstetrics & Gynecology

## 2022-01-17 ENCOUNTER — Other Ambulatory Visit: Payer: Self-pay

## 2022-01-17 ENCOUNTER — Inpatient Hospital Stay (HOSPITAL_COMMUNITY): Payer: Managed Care, Other (non HMO)

## 2022-01-17 ENCOUNTER — Encounter (HOSPITAL_COMMUNITY): Payer: Self-pay | Admitting: Obstetrics & Gynecology

## 2022-01-17 DIAGNOSIS — N888 Other specified noninflammatory disorders of cervix uteri: Secondary | ICD-10-CM

## 2022-01-17 DIAGNOSIS — Z3A32 32 weeks gestation of pregnancy: Secondary | ICD-10-CM | POA: Diagnosis not present

## 2022-01-17 DIAGNOSIS — B3731 Acute candidiasis of vulva and vagina: Secondary | ICD-10-CM

## 2022-01-17 DIAGNOSIS — O4693 Antepartum hemorrhage, unspecified, third trimester: Secondary | ICD-10-CM | POA: Diagnosis not present

## 2022-01-17 DIAGNOSIS — O4440 Low lying placenta NOS or without hemorrhage, unspecified trimester: Secondary | ICD-10-CM

## 2022-01-17 DIAGNOSIS — O4443 Low lying placenta NOS or without hemorrhage, third trimester: Secondary | ICD-10-CM

## 2022-01-17 DIAGNOSIS — D259 Leiomyoma of uterus, unspecified: Secondary | ICD-10-CM

## 2022-01-17 DIAGNOSIS — O468X3 Other antepartum hemorrhage, third trimester: Secondary | ICD-10-CM | POA: Insufficient documentation

## 2022-01-17 HISTORY — DX: Herpesviral infection of urogenital system, unspecified: A60.00

## 2022-01-17 LAB — WET PREP, GENITAL
Clue Cells Wet Prep HPF POC: NONE SEEN
Sperm: NONE SEEN
Trich, Wet Prep: NONE SEEN
WBC, Wet Prep HPF POC: >=10 — AB (ref <10)
Yeast Wet Prep HPF POC: NONE SEEN

## 2022-01-17 LAB — URINALYSIS, ROUTINE W REFLEX MICROSCOPIC
Bilirubin Urine: NEGATIVE
Glucose, UA: 150 mg/dL — AB
Hgb urine dipstick: NEGATIVE
Ketones, ur: NEGATIVE mg/dL
Nitrite: NEGATIVE
Protein, ur: NEGATIVE mg/dL
Specific Gravity, Urine: 1.009 (ref 1.005–1.030)
pH: 7 (ref 5.0–8.0)

## 2022-01-17 MED ORDER — TERCONAZOLE 0.4 % VA CREA: 1.0000 | TOPICAL_CREAM | Freq: Every day | VAGINAL | 0 refills | Status: DC

## 2022-01-17 NOTE — MAU Provider Note (Signed)
History     CSN: [id number]  Arrival date and time: 01/17/22 1216   None     Chief Complaint  Patient presents with   Abdominal Pain   Vaginal Bleeding   HPI Bonnie Friedman is a 31 y.o. G1P0 at 24w2dwho presents to MAU for vaginal bleeding. Patient reports she had a bowel movement around 11am and when she wiped she saw bright red blood. She was unsure if it was from her vagina or her rectum so she reports she wiped both areas separately and found that it was vaginal bleeding. She reports she has not had any bleeding since. She reports some mild, lower abdominal pain but attributes this pain to her fibroids as it is similar to previous fibroid pain. She also reports some vaginal itching for the past several days, however ran out of her previous yeast cream. She denies constipation and did not strain when she had BM. No urinary s/s. She endorses normal fetal movement.  Patient receives prenatal care at [facility]- next appointment is scheduled for tomorrow.     OB History     Gravida  1   Para      Term      Preterm      AB      Living         SAB      IAB      Ectopic      Multiple      Live Births              Past Medical History:  Diagnosis Date   Genital herpes    Uterine fibroid     Past Surgical History:  Procedure Laterality Date   KNEE ARTHROSCOPY Left    TYMPANOSTOMY TUBE PLACEMENT      Family History  Problem Relation Age of Onset   Stroke Father    Multiple sclerosis Father    Diabetes Maternal Grandmother     Social History   Tobacco Use   Smoking status: Never   Smokeless tobacco: Never  Substance Use Topics   Alcohol use: Not Currently   Drug use: Not Currently    Allergies:  Allergies  Allergen Reactions   Penicillins Itching    Medications Prior to Admission  Medication Sig Dispense Refill Last Dose   acetaminophen (TYLENOL) 500 MG tablet Take 1,000 mg by mouth every 6 (six) hours as needed.   Past  Month   aspirin EC 81 MG tablet Take 81 mg by mouth daily. Swallow whole.   01/16/2022   methocarbamol (ROBAXIN) 750 MG tablet Take 750 mg by mouth 4 (four) times daily.   Past Month   metroNIDAZOLE (METROGEL VAGINAL) 0.75 % vaginal gel Place 1 Applicatorful vaginally at bedtime. Insert one applicator, at bedtime, for 5 nights. 70 g 0 Past Week   oxyCODONE-acetaminophen (PERCOCET) 7.5-325 MG tablet Take 1 tablet by mouth every 4 (four) hours as needed for severe pain.   Past Month   Prenatal Vit-Fe Fumarate-FA (PRENATAL MULTIVITAMIN) TABS tablet Take 1 tablet by mouth daily at 12 noon.   01/16/2022   Probiotic Product (PROBIOTIC-10 PO) Take 1 tablet by mouth.   Past Week   progesterone (PROMETRIUM) 100 MG capsule Take 100 mg by mouth daily.   01/16/2022   valACYclovir (VALTREX) 500 MG tablet Take 500 mg by mouth 2 (two) times daily.   Past Week   cyclobenzaprine (FLEXERIL) 5 MG tablet Take 1 tablet (5 mg total) by mouth  3 (three) times daily as needed for muscle spasms. 20 tablet 0     Review of Systems  Constitutional: Negative.   Respiratory: Negative.    Cardiovascular: Negative.   Gastrointestinal: Negative.   Genitourinary:  Positive for vaginal bleeding (spotting). Negative for dysuria and vaginal discharge.       Vaginal itching  Musculoskeletal: Negative.   Neurological: Negative.    Physical Exam   Patient Vitals for the past 24 hrs:  BP Temp Temp src Pulse Resp SpO2 Height Weight  01/17/22 1427 117/65 -- -- 95 -- -- -- --  01/17/22 1241 128/75 -- -- (!) 101 -- -- -- --  01/17/22 1229 123/74 98.1 F (36.7 C) Oral (!) 102 16 98 % -- --  01/17/22 1223 -- -- -- -- -- -- 5' 5.5" (1.664 m) 85.7 kg    Physical Exam Vitals and nursing note reviewed. Exam conducted with a chaperone present.  Constitutional:      General: She is not in acute distress. Eyes:     Extraocular Movements: Extraocular movements intact.     Pupils: Pupils are equal, round, and reactive to light.   Cardiovascular:     Rate and Rhythm: Tachycardia present.  Pulmonary:     Effort: Pulmonary effort is normal.  Abdominal:     Palpations: Abdomen is soft.     Tenderness: There is no abdominal tenderness.     Comments: gravid  Genitourinary:    Comments: Normal external female genitalia, vaginal walls pink with rugae, moderate amount of thick, yellow discharge, some patches of discharge coating cervix and vaginal walls likely c/w yeast, cervix friable, cervix visually closed without lesions/masses Musculoskeletal:        General: Normal range of motion.  Skin:    General: Skin is warm and dry.  Neurological:     General: No focal deficit present.     Mental Status: She is alert and oriented to person, place, and time.  Psychiatric:        Mood and Affect: Mood normal.        Behavior: Behavior normal.        Judgment: Judgment normal.    Dilation: Closed Effacement (%): Thick Cervical Position: Posterior Exam by:: Patrecia Veiga cnm  NST FHR: 140 bpm, moderate variability, +15x15 accels, no decels Toco: quiet      MAU Course  Procedures  MDM UA, culture pending Wet prep, GC/CT [place] without evidence of abruption or previa. AFI wnl. Fibroids stable.  NST reassuring for gestational age. Toco quiet. Cervix closed/thick.  At this time, I suspect bleeding related to cervical friability secondary to yeast infection although wet prep negative. Will treat accordingly given patient presentation. Patient to keep prenatal appointment as scheduled tomorrow.   Assessment and Plan  [redacted] weeks gestation of pregnancy Friable cervix Vaginal bleeding Yeast vaginitis  - Discharge home in stable condition - Rx for terazol cream sent to pharmacy - Keep OB appointment as scheduled for tomorrow - Strict return precautions, return to MAU sooner or as needed for new/worsening symptoms   Renee Harder, CNM 01/17/2022, 2:32 PM

## 2022-01-17 NOTE — MAU Note (Signed)
Bonnie Friedman is a 31 y.o. at 10w2dhere in MAU reporting: this AM when using the bathroom she noticed some bleeding when she wiped. Did not see any bleeding when using the bathroom in MAU. Mild lower abdominal pain. +FM.  Onset of complaint: today  Pain score: 1/10  Vitals:   01/17/22 1229  BP: 123/74  Pulse: (!) 102  Resp: 16  Temp: 98.1 F (36.7 C)  SpO2: 98%     FHT:+FM   Lab orders placed from triage: UA

## 2022-01-18 LAB — CULTURE, OB URINE: Culture: NO GROWTH

## 2022-01-18 LAB — GC/CHLAMYDIA PROBE AMP (~~LOC~~) NOT AT ARMC
Chlamydia: NEGATIVE
Comment: NEGATIVE
Comment: NORMAL
Neisseria Gonorrhea: NEGATIVE

## 2022-02-17 ENCOUNTER — Telehealth (HOSPITAL_COMMUNITY): Payer: Self-pay | Admitting: *Deleted

## 2022-02-17 NOTE — Telephone Encounter (Signed)
Preadmission screen  

## 2022-02-17 NOTE — Patient Instructions (Signed)
Bonnie Friedman  02/17/2022   Your procedure is scheduled on:  03/03/2022  Arrive at [place] at Entrance C on [address] at [facility]  and [employer]. You are invited to use the FREE valet parking or use the Visitor's parking deck.  Pick up the phone at the desk and dial [phone].  Call this number if you have problems the morning of surgery: [phone]  Remember:   Do not eat food:(After Midnight) Desps de medianoche.  Do not drink clear liquids: (After Midnight) Desps de medianoche.  Take these medicines the morning of surgery with A SIP OF WATER:  Take valtrex as prescribed   Do not wear jewelry, make-up or nail polish.  Do not wear lotions, powders, or perfumes. Do not wear deodorant.  Do not shave 48 hours prior to surgery.  Do not bring valuables to the hospital.  [facility] is not   responsible for any belongings or valuables brought to the hospital.  Contacts, dentures or bridgework may not be worn into surgery.  Leave suitcase in the car. After surgery it may be brought to your room.  For patients admitted to the hospital, checkout time is 11:00 AM the day of              discharge.      Please read over the following fact sheets that you were given:     Preparing for Surgery

## 2022-02-18 ENCOUNTER — Encounter (HOSPITAL_COMMUNITY): Payer: Self-pay

## 2022-02-18 LAB — OB RESULTS CONSOLE GBS: GBS: NEGATIVE

## 2022-03-01 ENCOUNTER — Encounter (HOSPITAL_COMMUNITY)
Admission: AD | Disposition: A | Discharge status: Home / Self Care | Payer: Self-pay | Source: Home / Self Care | Attending: Obstetrics and Gynecology

## 2022-03-01 ENCOUNTER — Inpatient Hospital Stay (HOSPITAL_COMMUNITY): Payer: Managed Care, Other (non HMO) | Admitting: Anesthesiology

## 2022-03-01 ENCOUNTER — Encounter (HOSPITAL_COMMUNITY): Payer: Self-pay | Admitting: Obstetrics and Gynecology

## 2022-03-01 ENCOUNTER — Other Ambulatory Visit (HOSPITAL_COMMUNITY)
Admission: RE | Admit: 2022-03-01 | Discharge: 2022-03-01 | Disposition: A | Discharge status: Home / Self Care | Payer: Managed Care, Other (non HMO) | Source: Ambulatory Visit | Attending: Obstetrics and Gynecology | Admitting: Obstetrics and Gynecology

## 2022-03-01 ENCOUNTER — Other Ambulatory Visit: Payer: Self-pay

## 2022-03-01 ENCOUNTER — Inpatient Hospital Stay (HOSPITAL_COMMUNITY)
Admission: AD | Admit: 2022-03-01 | Discharge: 2022-03-04 | DRG: 788 | Disposition: A | Discharge status: Home / Self Care | Payer: Managed Care, Other (non HMO) | Attending: Obstetrics and Gynecology | Admitting: Obstetrics and Gynecology

## 2022-03-01 DIAGNOSIS — O4440 Low lying placenta NOS or without hemorrhage, unspecified trimester: Secondary | ICD-10-CM | POA: Insufficient documentation

## 2022-03-01 DIAGNOSIS — Z98891 History of uterine scar from previous surgery: Secondary | ICD-10-CM

## 2022-03-01 DIAGNOSIS — O4292 Full-term premature rupture of membranes, unspecified as to length of time between rupture and onset of labor: Secondary | ICD-10-CM | POA: Insufficient documentation

## 2022-03-01 DIAGNOSIS — Z3A38 38 weeks gestation of pregnancy: Secondary | ICD-10-CM

## 2022-03-01 DIAGNOSIS — D259 Leiomyoma of uterus, unspecified: Secondary | ICD-10-CM | POA: Insufficient documentation

## 2022-03-01 DIAGNOSIS — O328XX0 Maternal care for other malpresentation of fetus, not applicable or unspecified: Secondary | ICD-10-CM | POA: Diagnosis present

## 2022-03-01 DIAGNOSIS — O3413 Maternal care for benign tumor of corpus uteri, third trimester: Secondary | ICD-10-CM | POA: Diagnosis present

## 2022-03-01 DIAGNOSIS — O3412 Maternal care for benign tumor of corpus uteri, second trimester: Secondary | ICD-10-CM | POA: Insufficient documentation

## 2022-03-01 DIAGNOSIS — O329XX0 Maternal care for malpresentation of fetus, unspecified, not applicable or unspecified: Secondary | ICD-10-CM | POA: Insufficient documentation

## 2022-03-01 DIAGNOSIS — O321XX0 Maternal care for breech presentation, not applicable or unspecified: Secondary | ICD-10-CM | POA: Diagnosis present

## 2022-03-01 DIAGNOSIS — O4443 Low lying placenta NOS or without hemorrhage, third trimester: Secondary | ICD-10-CM

## 2022-03-01 HISTORY — DX: Nausea with vomiting, unspecified: R11.2

## 2022-03-01 HISTORY — DX: Other specified postprocedural states: Z98.890

## 2022-03-01 LAB — TYPE AND SCREEN
ABO/RH(D): A POS
Antibody Screen: NEGATIVE

## 2022-03-01 LAB — CBC
HCT: 37.7 % (ref 36.0–46.0)
Hemoglobin: 12.9 g/dL (ref 12.0–15.0)
MCH: 30.9 pg (ref 26.0–34.0)
MCHC: 34.2 g/dL (ref 30.0–36.0)
MCV: 90.4 fL (ref 80.0–100.0)
Platelets: 233 10*3/uL (ref 150–400)
RBC: 4.17 MIL/uL (ref 3.87–5.11)
RDW: 14.5 % (ref 11.5–15.5)
WBC: 8.4 10*3/uL (ref 4.0–10.5)
nRBC: 0 % (ref 0.0–0.2)

## 2022-03-01 LAB — POCT FERN TEST: POCT Fern Test: POSITIVE

## 2022-03-01 SURGERY — Surgical Case
Anesthesia: Spinal

## 2022-03-01 MED ORDER — DEXAMETHASONE SODIUM PHOSPHATE 10 MG/ML IJ SOLN
INTRAMUSCULAR | Status: AC
  Filled 2022-03-01: qty 1

## 2022-03-01 MED ORDER — OXYTOCIN-SODIUM CHLORIDE 30-0.9 UT/500ML-% IV SOLN
INTRAVENOUS | Status: DC | PRN
  Administered 2022-03-01: 250 mL/h via INTRAVENOUS

## 2022-03-01 MED ORDER — DEXAMETHASONE SODIUM PHOSPHATE 10 MG/ML IJ SOLN
INTRAMUSCULAR | Status: DC | PRN
  Administered 2022-03-01: 4 mg via INTRAVENOUS

## 2022-03-01 MED ORDER — MORPHINE SULFATE (PF) 0.5 MG/ML IJ SOLN
INTRAMUSCULAR | Status: AC
  Filled 2022-03-01: qty 10

## 2022-03-01 MED ORDER — ONDANSETRON HCL 4 MG/2ML IJ SOLN
4.0000 mg | Freq: Three times a day (TID) | INTRAMUSCULAR | Status: DC | PRN
  Administered 2022-03-03 (×2): 4 mg via INTRAVENOUS
  Filled 2022-03-01 (×2): qty 2

## 2022-03-01 MED ORDER — OXYTOCIN-SODIUM CHLORIDE 30-0.9 UT/500ML-% IV SOLN
INTRAVENOUS | Status: AC
  Filled 2022-03-01: qty 500

## 2022-03-01 MED ORDER — PHENYLEPHRINE HCL-NACL 20-0.9 MG/250ML-% IV SOLN
INTRAVENOUS | Status: DC | PRN
  Administered 2022-03-01: 40 ug/min via INTRAVENOUS

## 2022-03-01 MED ORDER — KETOROLAC TROMETHAMINE 30 MG/ML IJ SOLN: 30.0000 mg | Freq: Four times a day (QID) | INTRAMUSCULAR | Status: AC | PRN

## 2022-03-01 MED ORDER — FAMOTIDINE IN NACL 20-0.9 MG/50ML-% IV SOLN
20.0000 mg | Freq: Once | INTRAVENOUS | Status: AC
  Administered 2022-03-01: 20 mg via INTRAVENOUS
  Filled 2022-03-01: qty 50

## 2022-03-01 MED ORDER — GENTAMICIN SULFATE 40 MG/ML IJ SOLN
5.0000 mg/kg | INTRAVENOUS | Status: AC
  Administered 2022-03-01: 360 mg via INTRAVENOUS
  Filled 2022-03-01: qty 9

## 2022-03-01 MED ORDER — ACETAMINOPHEN 10 MG/ML IV SOLN: 1000.0000 mg | Freq: Once | INTRAVENOUS | Status: DC | PRN

## 2022-03-01 MED ORDER — BUPIVACAINE HCL (PF) 0.25 % IJ SOLN
INTRAMUSCULAR | Status: AC
  Filled 2022-03-01: qty 30

## 2022-03-01 MED ORDER — NALOXONE HCL 0.4 MG/ML IJ SOLN: 0.4000 mg | INTRAMUSCULAR | Status: DC | PRN

## 2022-03-01 MED ORDER — BUPIVACAINE IN DEXTROSE 0.75-8.25 % IT SOLN
INTRATHECAL | Status: DC | PRN
  Administered 2022-03-01: 1.6 mL via INTRATHECAL

## 2022-03-01 MED ORDER — VANCOMYCIN HCL IN DEXTROSE 1-5 GM/200ML-% IV SOLN
1000.0000 mg | Freq: Once | INTRAVENOUS | Status: AC
  Administered 2022-03-01: 1000 mg via INTRAVENOUS
  Filled 2022-03-01: qty 200

## 2022-03-01 MED ORDER — DIPHENHYDRAMINE HCL 25 MG PO CAPS
25.0000 mg | ORAL_CAPSULE | ORAL | Status: DC | PRN
  Filled 2022-03-01 (×2): qty 1

## 2022-03-01 MED ORDER — LACTATED RINGERS IV SOLN: INTRAVENOUS | Status: DC

## 2022-03-01 MED ORDER — NALOXONE HCL 4 MG/10ML IJ SOLN: 1.0000 ug/kg/h | INTRAVENOUS | Status: DC | PRN

## 2022-03-01 MED ORDER — PHENYLEPHRINE 80 MCG/ML (10ML) SYRINGE FOR IV PUSH (FOR BLOOD PRESSURE SUPPORT)
PREFILLED_SYRINGE | INTRAVENOUS | Status: AC
  Filled 2022-03-01: qty 10

## 2022-03-01 MED ORDER — LACTATED RINGERS IV BOLUS: 1000.0000 mL | Freq: Once | INTRAVENOUS | Status: DC

## 2022-03-01 MED ORDER — SOD CITRATE-CITRIC ACID 500-334 MG/5ML PO SOLN
30.0000 mL | ORAL | Status: AC
  Administered 2022-03-01: 30 mL via ORAL
  Filled 2022-03-01: qty 30

## 2022-03-01 MED ORDER — FENTANYL CITRATE (PF) 100 MCG/2ML IJ SOLN
INTRAMUSCULAR | Status: DC | PRN
  Administered 2022-03-01: 15 ug via INTRATHECAL

## 2022-03-01 MED ORDER — DIPHENHYDRAMINE HCL 50 MG/ML IJ SOLN
12.5000 mg | INTRAMUSCULAR | Status: DC | PRN
  Administered 2022-03-02: 12.5 mg via INTRAVENOUS
  Filled 2022-03-01: qty 1

## 2022-03-01 MED ORDER — MORPHINE SULFATE (PF) 0.5 MG/ML IJ SOLN
INTRAMUSCULAR | Status: DC | PRN
  Administered 2022-03-01: 150 ug via INTRATHECAL

## 2022-03-01 MED ORDER — SODIUM CHLORIDE 0.9 % IR SOLN
Status: DC | PRN
  Administered 2022-03-01: 1

## 2022-03-01 MED ORDER — SODIUM CHLORIDE 0.9% FLUSH: 3.0000 mL | INTRAVENOUS | Status: DC | PRN

## 2022-03-01 MED ORDER — FENTANYL CITRATE (PF) 100 MCG/2ML IJ SOLN: 25.0000 ug | INTRAMUSCULAR | Status: DC | PRN

## 2022-03-01 MED ORDER — ONDANSETRON HCL 4 MG/2ML IJ SOLN
INTRAMUSCULAR | Status: AC
  Filled 2022-03-01: qty 2

## 2022-03-01 MED ORDER — FENTANYL CITRATE (PF) 100 MCG/2ML IJ SOLN
INTRAMUSCULAR | Status: AC
  Filled 2022-03-01: qty 2

## 2022-03-01 MED ORDER — SODIUM CHLORIDE 0.9 % IV SOLN
500.0000 mg | INTRAVENOUS | Status: AC
  Administered 2022-03-01: 500 mg via INTRAVENOUS
  Filled 2022-03-01: qty 5

## 2022-03-01 MED ORDER — SCOPOLAMINE 1 MG/3DAYS TD PT72
1.0000 | MEDICATED_PATCH | Freq: Once | TRANSDERMAL | Status: DC
  Administered 2022-03-02: 1.5 mg via TRANSDERMAL
  Filled 2022-03-01: qty 1

## 2022-03-01 MED ORDER — ONDANSETRON HCL 4 MG/2ML IJ SOLN
INTRAMUSCULAR | Status: DC | PRN
  Administered 2022-03-01: 4 mg via INTRAVENOUS

## 2022-03-01 MED ORDER — ACETAMINOPHEN 500 MG PO TABS
1000.0000 mg | ORAL_TABLET | Freq: Four times a day (QID) | ORAL | Status: AC
  Administered 2022-03-02 (×3): 1000 mg via ORAL
  Filled 2022-03-01 (×3): qty 2

## 2022-03-01 SURGICAL SUPPLY — 31 items
BARRIER ADHS 3X4 INTERCEED (GAUZE/BANDAGES/DRESSINGS) IMPLANT
BENZOIN TINCTURE PRP APPL 2/3 (GAUZE/BANDAGES/DRESSINGS) IMPLANT
CHLORAPREP W/TINT 26ML (MISCELLANEOUS) ×2 IMPLANT
CLAMP UMBILICAL CORD (MISCELLANEOUS) ×1 IMPLANT
CLOTH BEACON ORANGE TIMEOUT ST (SAFETY) ×1 IMPLANT
DRSG OPSITE POSTOP 4X10 (GAUZE/BANDAGES/DRESSINGS) ×1 IMPLANT
ELECT REM PT RETURN 9FT ADLT (ELECTROSURGICAL) ×1
ELECTRODE REM PT RTRN 9FT ADLT (ELECTROSURGICAL) ×1 IMPLANT
EXTRACTOR VACUUM M CUP 4 TUBE (SUCTIONS) IMPLANT
GLOVE BIO SURGEON STRL SZ 6.5 (GLOVE) ×1 IMPLANT
GLOVE BIOGEL PI IND STRL 7.0 (GLOVE) ×1 IMPLANT
GOWN STRL REUS W/TWL LRG LVL3 (GOWN DISPOSABLE) ×2 IMPLANT
KIT ABG SYR 3ML LUER SLIP (SYRINGE) IMPLANT
NDL HYPO 25X5/8 SAFETYGLIDE (NEEDLE) ×1 IMPLANT
NEEDLE HYPO 22GX1.5 SAFETY (NEEDLE) IMPLANT
NEEDLE HYPO 25X5/8 SAFETYGLIDE (NEEDLE) ×1 IMPLANT
NS IRRIG 1000ML POUR BTL (IV SOLUTION) ×1 IMPLANT
PACK C SECTION WH (CUSTOM PROCEDURE TRAY) ×1 IMPLANT
PAD OB MATERNITY 4.3X12.25 (PERSONAL CARE ITEMS) ×1 IMPLANT
STRIP CLOSURE SKIN 1/2X4 (GAUZE/BANDAGES/DRESSINGS) IMPLANT
SUT CHROMIC 0 CTX 36 (SUTURE) ×2 IMPLANT
SUT PLAIN 0 NONE (SUTURE) IMPLANT
SUT PLAIN 2 0 XLH (SUTURE) IMPLANT
SUT VIC AB 0 CT1 27 (SUTURE) ×3
SUT VIC AB 0 CT1 27XBRD ANBCTR (SUTURE) ×3 IMPLANT
SUT VIC AB 4-0 KS 27 (SUTURE) IMPLANT
SYR CONTROL 10ML LL (SYRINGE) IMPLANT
TOWEL OR 17X24 6PK STRL BLUE (TOWEL DISPOSABLE) ×1 IMPLANT
TRAY FOLEY W/BAG SLVR 14FR LF (SET/KITS/TRAYS/PACK) ×1 IMPLANT
VACUUM CUP M-STYLE MYSTIC II (SUCTIONS) IMPLANT
WATER STERILE IRR 1000ML POUR (IV SOLUTION) ×1 IMPLANT

## 2022-03-01 NOTE — H&P (Signed)
Bonnie Friedman is 31 year old G 1 P 0 at 85 w 3 days presents with SROM and Breech. Ate sub sandwich at 5 pm, SROM at 730 pm  On arrival was 4 cm dilated , now 5 to 6 and will proceed with C Section OB History     Gravida  1   Para      Term      Preterm      AB      Living         SAB      IAB      Ectopic      Multiple      Live Births             Past Medical History:  Diagnosis Date   Genital herpes    PONV (postoperative nausea and vomiting)    Uterine fibroid    Past Surgical History:  Procedure Laterality Date   KNEE ARTHROSCOPY Left    TYMPANOSTOMY TUBE PLACEMENT     Family History: family history includes Diabetes in her maternal grandmother; Multiple sclerosis in her father; Stroke in her father. Social History:  reports that she has never smoked. She has never used smokeless tobacco. She reports that she does not currently use alcohol. She reports that she does not currently use drugs.     Maternal Diabetes: No Genetic Screening: Normal Maternal Ultrasounds/Referrals: Normal Fetal Ultrasounds or other Referrals:  None Maternal Substance Abuse:  No Significant Maternal Medications:  None Significant Maternal Lab Results:  None Number of Prenatal Visits:greater than 3 verified prenatal visits Other Comments:  None  Review of Systems  All other systems reviewed and are negative.  Maternal Medical History:  Reason for admission: Rupture of membranes and contractions.   Prenatal complications: no prenatal complications   Dilation: 5.5 Effacement (%): 90 Station: -2 Exam by:: weston,rn Blood pressure 137/89, pulse 96, temperature 98.1 F (36.7 C), resp. rate 17, height '5\' 6"'$  (1.676 m), weight 89.4 kg, SpO2 100 %. Maternal Exam:  Abdomen: Fetal presentation: breech   Physical Exam Vitals and nursing note reviewed. Exam conducted with a chaperone present.  Constitutional:      Appearance: Normal appearance.  HENT:     Head:  Normocephalic.     Right Ear: Tympanic membrane normal.  Cardiovascular:     Rate and Rhythm: Normal rate and regular rhythm.     Pulses: Normal pulses.  Neurological:     Mental Status: She is alert.     Prenatal labs: ABO, Rh: --/--/A POS (10/17 4967) Antibody: NEG (10/17 5916) Rubella: Immune (05/24 0000) RPR: Nonreactive (05/24 0000)  HBsAg: Negative (05/24 0000)  HIV: Non-reactive (05/24 0000)  GBS:     Assessment/Plan: IUP at 38 w 3 days Breech SROM Active Labor Proceed with Primary LTCS  Risks reviewed OR notified   Cyril Mourning 03/01/2022, 10:13 PM

## 2022-03-01 NOTE — MAU Provider Note (Signed)
History   [id number]   Chief Complaint  Patient presents with   Rupture of Membranes    HPI Bonnie Friedman is a 31 y.o. female  G1P0 '@38'$ .3 wks here with report of gush of clear fluid '@1930'$ .  Leaking of fluid has not continued. Pt reports occasional contractions. She denies vaginal bleeding. She reports good fetal movement. All other systems negative.    No LMP recorded. Patient is pregnant.  OB History  Gravida Para Term Preterm AB Living  1            SAB IAB Ectopic Multiple Live Births               # Outcome Date GA Lbr Len/2nd Weight Sex Delivery Anes PTL Lv  1 Current             Past Medical History:  Diagnosis Date   Genital herpes    PONV (postoperative nausea and vomiting)    Uterine fibroid     Family History  Problem Relation Age of Onset   Stroke Father    Multiple sclerosis Father    Diabetes Maternal Grandmother     Social History   Socioeconomic History   Marital status: Single    Spouse name: Not on file   Number of children: Not on file   Years of education: Not on file   Highest education level: Not on file  Occupational History   Not on file  Tobacco Use   Smoking status: Never   Smokeless tobacco: Never  Vaping Use   Vaping Use: Never used  Substance and Sexual Activity   Alcohol use: Not Currently   Drug use: Not Currently   Sexual activity: Yes    Birth control/protection: None  Other Topics Concern   Not on file  Social History Narrative   Not on file   Social Determinants of Health   Financial Resource Strain: Not on file  Food Insecurity: Not on file  Transportation Needs: Not on file  Physical Activity: Not on file  Stress: Not on file  Social Connections: Not on file    Allergies  Allergen Reactions   Penicillins Itching    No current facility-administered medications on file prior to encounter.   Current Outpatient Medications on File Prior to Encounter  Medication Sig Dispense Refill   aspirin EC 81 MG  tablet Take 81 mg by mouth daily. Swallow whole.     methocarbamol (ROBAXIN) 750 MG tablet Take 750 mg by mouth every 8 (eight) hours as needed for muscle spasms.     oxyCODONE-acetaminophen (PERCOCET) 7.5-325 MG tablet Take 1 tablet by mouth every 4 (four) hours as needed for severe pain.     Prenatal Vit-Fe Fumarate-FA (PRENATAL MULTIVITAMIN) TABS tablet Take 1 tablet by mouth daily.     valACYclovir (VALTREX) 500 MG tablet Take 500 mg by mouth daily.       Review of Systems  Gastrointestinal:  Negative for abdominal pain.  Genitourinary:  Positive for vaginal discharge. Negative for vaginal bleeding.     Physical Exam   Vitals:   03/01/22 2005 03/01/22 2007  BP:  125/80  Pulse: 95   Resp: 17   Temp: 98.1 F (36.7 C)   SpO2: 99%   Weight: 89.4 kg   Height: '5\' 6"'$  (1.676 m)     Physical Exam Vitals and nursing note reviewed. Exam conducted with a chaperone present.  Constitutional:      General: She is not in acute  distress.    Appearance: Normal appearance.  HENT:     Head: Normocephalic and atraumatic.  Cardiovascular:     Rate and Rhythm: Normal rate.  Pulmonary:     Effort: Pulmonary effort is normal. No respiratory distress.  Abdominal:     Palpations: Abdomen is soft.     Tenderness: There is no abdominal tenderness.  Genitourinary:    Comments: Clear fluid trickling from introitus; +fern Musculoskeletal:        General: Normal range of motion.     Cervical back: Normal range of motion.  Skin:    General: Skin is warm and dry.  Neurological:     General: No focal deficit present.     Mental Status: She is alert and oriented to person, place, and time.  Psychiatric:        Mood and Affect: Mood normal.        Behavior: Behavior normal.   EFM: 150 bpm, mod variability, no accels, no decels Toco: irreg  Breech by BSUS MAU Course  Procedures  MDM SROM and breech confirmed. Dr. Helane Rima notified by RN. Plan for OR.  Assessment and Plan  [redacted] weeks  gestation PROM at term Fetal malpresentation Admit to OR Mngt per Dr. Lucillie Garfinkel, [place], [facility] 03/01/2022 8:48 PM

## 2022-03-01 NOTE — Anesthesia Preprocedure Evaluation (Signed)
Anesthesia Evaluation  Patient identified by MRN, date of birth, ID band Patient awake    Reviewed: Allergy & Precautions, NPO status , Patient's Chart, lab work & pertinent test results  History of Anesthesia Complications (+) PONV and history of anesthetic complications  Airway Mallampati: II  TM Distance: >3 FB Neck ROM: Full    Dental no notable dental hx.    Pulmonary neg pulmonary ROS,    Pulmonary exam normal breath sounds clear to auscultation       Cardiovascular negative cardio ROS Normal cardiovascular exam Rhythm:Regular Rate:Normal     Neuro/Psych negative neurological ROS  negative psych ROS   GI/Hepatic negative GI ROS, Neg liver ROS,   Endo/Other  negative endocrine ROS  Renal/GU negative Renal ROS  negative genitourinary   Musculoskeletal negative musculoskeletal ROS (+)   Abdominal   Peds  Hematology negative hematology ROS (+)   Anesthesia Other Findings Primary C/S for SROM, laboring, and breech presentation.   Reproductive/Obstetrics (+) Pregnancy                             Anesthesia Physical Anesthesia Plan  ASA: 2  Anesthesia Plan: Spinal   Post-op Pain Management:    Induction:   PONV Risk Score and Plan: Treatment may vary due to age or medical condition  Airway Management Planned: Natural Airway  Additional Equipment:   Intra-op Plan:   Post-operative Plan:   Informed Consent: I have reviewed the patients History and Physical, chart, labs and discussed the procedure including the risks, benefits and alternatives for the proposed anesthesia with the patient or authorized representative who has indicated his/her understanding and acceptance.     Dental advisory given  Plan Discussed with: CRNA  Anesthesia Plan Comments:         Anesthesia Quick Evaluation

## 2022-03-01 NOTE — Transfer of Care (Signed)
Immediate Anesthesia Transfer of Care Note  Patient: Bonnie Friedman  Procedure(s) Performed: PRIMARY CESAREAN SECTION  EDC: 03-12-22 ALLERG: PENICILLINS  Patient Location: PACU  Anesthesia Type:Regional and Spinal  Level of Consciousness: awake, alert , oriented and patient cooperative  Airway & Oxygen Therapy: Patient Spontanous Breathing  Post-op Assessment: Report given to RN and Post -op Vital signs reviewed and stable  Post vital signs: Reviewed and stable  Last Vitals:  Vitals Value Taken Time  BP 114/67 03/01/22 2345  Temp 36.7 C 03/01/22 2345  Pulse 90 03/01/22 2349  Resp 15 03/01/22 2349  SpO2 100 % 03/01/22 2349  Vitals shown include unvalidated device data.  Last Pain:  Vitals:   03/01/22 2345  PainSc: 0-No pain         Complications: No notable events documented.

## 2022-03-01 NOTE — Anesthesia Procedure Notes (Signed)
Spinal  Patient location during procedure: OR Start time: 03/01/2022 10:40 PM End time: 03/01/2022 10:42 PM Reason for block: surgical anesthesia Staffing Performed: anesthesiologist  Anesthesiologist: Freddrick March, MD Performed by: Freddrick March, MD Authorized by: Freddrick March, MD   Preanesthetic Checklist Completed: patient identified, IV checked, risks and benefits discussed, surgical consent, monitors and equipment checked, pre-op evaluation and timeout performed Spinal Block Patient position: sitting Prep: DuraPrep and site prepped and draped Patient monitoring: cardiac monitor, continuous pulse ox and blood pressure Approach: midline Location: L3-4 Injection technique: single-shot Needle Needle type: Pencan  Needle gauge: 24 G Needle length: 9 cm Assessment Sensory level: T6 Events: CSF return Additional Notes Functioning IV was confirmed and monitors were applied. Sterile prep and drape, including hand hygiene and sterile gloves were used. The patient was positioned and the spine was prepped. The skin was anesthetized with lidocaine.  Free flow of clear CSF was obtained prior to injecting local anesthetic into the CSF.  The spinal needle aspirated freely following injection.  The needle was carefully withdrawn.  The patient tolerated the procedure well.

## 2022-03-01 NOTE — Brief Op Note (Signed)
03/01/2022  11:29 PM  PATIENT:  Bonnie Friedman  31 y.o. female  PRE-OPERATIVE DIAGNOSIS:   IUP at term SROM Active Labor Breech  Fibroids  POST-OPERATIVE DIAGNOSIS:   IUP at term SROM Active LABOR Double Footling Breech Fibroids  PROCEDURE:  Procedure(s): PRIMARY CESAREAN SECTION  EDC: 03-12-22 ALLERG: PENICILLINS (N/A)  SURGEON:  Surgeon(s) and Role:    * Dian Queen, MD - Primary  PHYSICIAN ASSISTANT:   ASSISTANTS: none   ANESTHESIA:   spinal  EBL:  551 mL   BLOOD ADMINISTERED:none  DRAINS: Urinary Catheter (Foley)   LOCAL MEDICATIONS USED:  NONE  SPECIMEN:  No Specimen  DISPOSITION OF SPECIMEN:  N/A  COUNTS:  YES  TOURNIQUET:  * No tourniquets in log *  DICTATION: .Other Dictation: Dictation Number dictated  PLAN OF CARE: Admit to inpatient   PATIENT DISPOSITION:  PACU - hemodynamically stable.   Delay start of Pharmacological VTE agent (>24hrs) due to surgical blood loss or risk of bleeding: not applicable

## 2022-03-01 NOTE — MAU Note (Signed)
.  Bonnie Friedman is a 31 y.o. at 67w3dhere in MAU reporting leaking clear fld since 1930. No pain. Good FM. For primary c/s on 10/19 due to fibroids and baby is breech  Onset of complaint: 1930 Pain score: 0 Vitals:   03/01/22 2005 03/01/22 2007  BP:  125/80  Pulse: 95   Resp: 17   Temp: 98.1 F (36.7 C)   SpO2: 99%      FHT:151 Lab orders placed from triage: mau labor eval

## 2022-03-02 ENCOUNTER — Encounter (HOSPITAL_COMMUNITY): Payer: Self-pay | Admitting: Obstetrics and Gynecology

## 2022-03-02 DIAGNOSIS — Z98891 History of uterine scar from previous surgery: Secondary | ICD-10-CM

## 2022-03-02 LAB — CBC
HCT: 34.2 % — ABNORMAL LOW (ref 36.0–46.0)
Hemoglobin: 11.8 g/dL — ABNORMAL LOW (ref 12.0–15.0)
MCH: 31.2 pg (ref 26.0–34.0)
MCHC: 34.5 g/dL (ref 30.0–36.0)
MCV: 90.5 fL (ref 80.0–100.0)
Platelets: 227 10*3/uL (ref 150–400)
RBC: 3.78 MIL/uL — ABNORMAL LOW (ref 3.87–5.11)
RDW: 14.3 % (ref 11.5–15.5)
WBC: 15.2 10*3/uL — ABNORMAL HIGH (ref 4.0–10.5)
nRBC: 0 % (ref 0.0–0.2)

## 2022-03-02 LAB — RPR: RPR Ser Ql: NONREACTIVE

## 2022-03-02 MED ORDER — VALACYCLOVIR HCL 500 MG PO TABS
500.0000 mg | ORAL_TABLET | Freq: Every day | ORAL | Status: DC
  Administered 2022-03-03 – 2022-03-04 (×2): 500 mg via ORAL
  Filled 2022-03-02 (×3): qty 1

## 2022-03-02 MED ORDER — ZOLPIDEM TARTRATE 5 MG PO TABS: 5.0000 mg | ORAL_TABLET | Freq: Every evening | ORAL | Status: DC | PRN

## 2022-03-02 MED ORDER — SIMETHICONE 80 MG PO CHEW
80.0000 mg | CHEWABLE_TABLET | Freq: Three times a day (TID) | ORAL | Status: DC
  Administered 2022-03-02 – 2022-03-04 (×8): 80 mg via ORAL
  Filled 2022-03-02 (×9): qty 1

## 2022-03-02 MED ORDER — OXYTOCIN-SODIUM CHLORIDE 30-0.9 UT/500ML-% IV SOLN: 2.5000 [IU]/h | INTRAVENOUS | Status: AC

## 2022-03-02 MED ORDER — MENTHOL 3 MG MT LOZG: 1.0000 | LOZENGE | OROMUCOSAL | Status: DC | PRN

## 2022-03-02 MED ORDER — DIBUCAINE (PERIANAL) 1 % EX OINT: 1.0000 | TOPICAL_OINTMENT | CUTANEOUS | Status: DC | PRN

## 2022-03-02 MED ORDER — ACETAMINOPHEN 325 MG PO TABS
650.0000 mg | ORAL_TABLET | ORAL | Status: DC | PRN
  Administered 2022-03-03: 650 mg via ORAL
  Filled 2022-03-02: qty 2

## 2022-03-02 MED ORDER — DIPHENHYDRAMINE HCL 25 MG PO CAPS
25.0000 mg | ORAL_CAPSULE | Freq: Four times a day (QID) | ORAL | Status: DC | PRN
  Administered 2022-03-02 – 2022-03-03 (×3): 25 mg via ORAL
  Filled 2022-03-02: qty 1

## 2022-03-02 MED ORDER — TETANUS-DIPHTH-ACELL PERTUSSIS 5-2.5-18.5 LF-MCG/0.5 IM SUSY: 0.5000 mL | PREFILLED_SYRINGE | Freq: Once | INTRAMUSCULAR | Status: DC

## 2022-03-02 MED ORDER — COCONUT OIL OIL: 1.0000 | TOPICAL_OIL | Status: DC | PRN

## 2022-03-02 MED ORDER — SENNOSIDES-DOCUSATE SODIUM 8.6-50 MG PO TABS
2.0000 | ORAL_TABLET | Freq: Every day | ORAL | Status: DC
  Administered 2022-03-02 – 2022-03-04 (×3): 2 via ORAL
  Filled 2022-03-02 (×3): qty 2

## 2022-03-02 MED ORDER — OXYCODONE HCL 5 MG PO TABS
5.0000 mg | ORAL_TABLET | ORAL | Status: DC | PRN
  Administered 2022-03-02 – 2022-03-04 (×12): 10 mg via ORAL
  Filled 2022-03-02 (×13): qty 2

## 2022-03-02 MED ORDER — PRENATAL MULTIVITAMIN CH
1.0000 | ORAL_TABLET | Freq: Every day | ORAL | Status: DC
  Administered 2022-03-02 – 2022-03-04 (×3): 1 via ORAL
  Filled 2022-03-02 (×3): qty 1

## 2022-03-02 MED ORDER — IBUPROFEN 600 MG PO TABS
600.0000 mg | ORAL_TABLET | Freq: Four times a day (QID) | ORAL | Status: DC
  Administered 2022-03-02 – 2022-03-04 (×10): 600 mg via ORAL
  Filled 2022-03-02 (×10): qty 1

## 2022-03-02 MED ORDER — SIMETHICONE 80 MG PO CHEW
80.0000 mg | CHEWABLE_TABLET | ORAL | Status: DC | PRN
  Administered 2022-03-03: 80 mg via ORAL
  Filled 2022-03-02: qty 1

## 2022-03-02 MED ORDER — OXYTOCIN-SODIUM CHLORIDE 30-0.9 UT/500ML-% IV SOLN
INTRAVENOUS | Status: AC
  Filled 2022-03-02: qty 500

## 2022-03-02 MED ORDER — WITCH HAZEL-GLYCERIN EX PADS: 1.0000 | MEDICATED_PAD | CUTANEOUS | Status: DC | PRN

## 2022-03-02 NOTE — Anesthesia Postprocedure Evaluation (Signed)
Anesthesia Post Note  Patient: Rudolph Dobler  Procedure(s) Performed: PRIMARY CESAREAN SECTION  EDC: 03-12-22 ALLERG: PENICILLINS     Patient location during evaluation: PACU Anesthesia Type: Spinal Level of consciousness: oriented and awake and alert Pain management: pain level controlled Vital Signs Assessment: post-procedure vital signs reviewed and stable Respiratory status: spontaneous breathing, respiratory function stable and patient connected to nasal cannula oxygen Cardiovascular status: blood pressure returned to baseline and stable Postop Assessment: no headache, no backache and no apparent nausea or vomiting Anesthetic complications: no   No notable events documented.  Last Vitals:  Vitals:   03/02/22 0100 03/02/22 0236  BP: 109/72 120/73  Pulse: 88 82  Resp: 18 18  Temp: 36.4 C 36.9 C  SpO2: 100% 100%    Last Pain:  Vitals:   03/02/22 0236  TempSrc: Oral  PainSc: 6    Pain Goal:                   Cydney Alvarenga L Tayli Buch

## 2022-03-02 NOTE — Op Note (Unsigned)
[name], [name] MEDICAL RECORD NO: [id number] ACCOUNT NO: [id number] DATE OF BIRTH: 1990-06-16 FACILITY: [facility] LOCATION: [facility] PHYSICIAN: Sharyn Lull L. Helane Rima, MD  Operative Report   DATE OF PROCEDURE: 03/01/2022  PREOPERATIVE DIAGNOSES:  Intrauterine pregnancy at term, spontaneous rupture of membrane, active labor of breath and fibroids.  POSTOPERATIVE DIAGNOSES:  Intrauterine pregnancy at term, spontaneous rupture of membrane, active labor, double footling breech and fibroids.  PROCEDURE:  Primary low transverse cesarean section.  SURGEON:  Krystal Teachey L. Helane Rima, MD  ANESTHESIA:  Spinal.  ESTIMATED BLOOD LOSS:  Per anesthesia record.  COMPLICATIONS:  None.  DESCRIPTION OF PROCEDURE:  The patient was taken to the operating room.  She was then administered a spinal.  She was prepped and draped and Foley catheter was inserted.  A low transverse incision was made.  It was carried down to the fascia.  Fascia was  scored in the midline and extended laterally.  The rectus muscles were separated in the midline.  The peritoneum was entered bluntly.  The peritoneal incision was then stretched.  The bladder blade was inserted.  The bladder flap was created sharply and  then digitally and the bladder blade was then readjusted.  A low transverse incision was made in the uterus.  The baby was in double footling breech presentation and was delivered via complete breech extraction.  When I inserted my hand to deliver the  baby, the baby's feet were in the vagina.  The cord was clamped and cut.  The baby was handed to the waiting NICU team.  The baby was a female infant.  The cord blood was obtained.  The placenta was manually removed, noted to be normal and intact with a  3-vessel cord.  The uterus was cleared of all clots and debris.  Because the uterus was enlarged because of fibroids, I did not exteriorize the uterus; however, I was able to close incision in one layer using 0 chromic in a running  locked stitch.  She  had multiple fibroids, large fibroids, especially low in the lower uterine segment posteriorly.  Hemostasis was very good.  Irrigation was performed.  Hemostasis was again noted.  The peritoneum was closed using 0 Vicryl.  The fascia was closed using 0  Vicryl running stitch and the skin was closed with a 3-0 Vicryl subcuticular.  Benzoin, Steri-Strips, and a honeycomb dressing were applied.  All sponge, lap and instrument counts were correct x2.  The patient went to recovery room in stable condition.   VAI D: 03/01/2022 11:38:13 pm T: 03/02/2022 1:29:00 am  JOB: [id number]/ [id number]

## 2022-03-02 NOTE — Lactation Note (Signed)
This note was copied from a baby's chart. Lactation Consultation Note  Patient Name: Bonnie Friedman Date: 03/02/2022 Reason for consult: Initial assessment;Primapara;Early term 37-38.6wks Age:32 hours Placed baby at the breast. Baby suckling, smacking his lips. Not opening very wide for latch. Breast tissue w/edema/heavy. Reverse pressure a little helpful.  Baby sleepy after suckling about 5 minutes off and on w/LC holding breast tissue in mouth w/t-cup hold. If lets go baby unable to hold nipple in his mouth. Newborn feeding habits, behavior, STS, I&O, support, positioning, props, body alignment during feedings reviewed. Mom tired. Encouraged to call for assistance as needed. W/having c/section mom has limited mobility and IV in [facility] and she can't bend her Lt. Arm for that.  Maternal Data Has patient been taught Hand Expression?: Yes Does the patient have breastfeeding experience prior to this delivery?: No  Feeding    LATCH Score Latch: Repeated attempts needed to sustain latch, nipple held in mouth throughout feeding, stimulation needed to elicit sucking reflex.  Audible Swallowing: None  Type of Nipple: Flat  Comfort (Breast/Nipple): Filling, red/small blisters or bruises, mild/mod discomfort (edema/heavy breast)  Hold (Positioning): Full assist, staff holds infant at breast  LATCH Score: 3   Lactation Tools Discussed/Used    Interventions Interventions: Breast feeding basics reviewed;Adjust position;Assisted with latch;Support pillows;Skin to skin;Position options;Breast massage;Hand express;LC Services brochure;Reverse pressure;Breast compression  Discharge    Consult Status Consult Status: Follow-up Date: 03/03/22 Follow-up type: In-patient    [place]den Soley, Elta Guadeloupe 03/02/2022, 6:11 AM

## 2022-03-02 NOTE — Lactation Note (Signed)
This note was copied from a baby's chart. Lactation Consultation Note  Patient Name: Bonnie Friedman [id number] Date: 03/02/2022 Reason for consult: Follow-up assessment;Primapara;Difficult latch;Mother's request;Early term 37-38.6wks Age:32 hours  LC in to visit with P17 Mom of ET infant delivered by C/S.  Mom having difficulty latching baby to the breast, baby has been sleepy.  MD just finished examining baby.  Baby showing feeding cues.  Offered to assist with latching.  Reviewed hand expression, small drop noted on nipple.  In football hold on left breast, baby unable to sustain a deep latch to the breast.  Repeated attempts made.  Breasts are heavy and firm, nipples are erect and short.  Rolled up cloth placed under breast.  Positioned baby on his side at height of nipple.  LC initiated a 20 mm nipple shield, showing Mom how to properly place.  Baby latched with a somewhat shallow latch.  After adjusting baby's position to lead with chin and keep neck straight, baby would suck vigorously with an occasional swallow.  Baby consistently fed for 25 mins, some colostrum noted in shield.    Hypoluxo set up DEBP and sized for 21 mm flanges.  Taught FOB how to disassemble and wash parts and 2 bins provided.  RN aware of Mom needing guidance on use of pump as she went into bathroom following feeding.  Talked about donor breast milk availability for supplementation as temporary bridge to mother's own milk.    Plan- 1- Keep baby STS as much as possible 2- Offer the breast often with cues, awaken baby if he has been sleeping 3 hrs 3-Use nipple shield to help baby latch and sustain latch prn 4- Pump both breasts on initiation setting 5-Supplement baby with EBM+/donor milk+/formula by paced bottle after breastfeeding   LATCH Score Latch: Repeated attempts needed to sustain latch, nipple held in mouth throughout feeding, stimulation needed to elicit sucking reflex.  Audible Swallowing: A few with  stimulation  Type of Nipple: Everted at rest and after stimulation (short nipple shafts)  Comfort (Breast/Nipple): Soft / non-tender  Hold (Positioning): Assistance needed to correctly position infant at breast and maintain latch.  LATCH Score: 7   Lactation Tools Discussed/Used Tools: Pump;Flanges;Nipple Shields Nipple shield size: 20 Flange Size: 21 Breast pump type: Double-Electric Breast Pump;Manual Pump Education: Setup, frequency, and cleaning Reason for Pumping: Support milk supply Pumping frequency: Mom encouraged to pump 15 mins after breastfeeding  Interventions Interventions: Breast feeding basics reviewed;Assisted with latch;Skin to skin;Breast massage;Hand express;Pre-pump if needed;Breast compression;Adjust position;Support pillows;Position options;Hand pump;DEBP  Discharge Pump: Personal;Hands Free Proofreader)  Consult Status Consult Status: Follow-up Date: 03/03/22 Follow-up type: [place] 03/02/2022, 3:41 PM

## 2022-03-02 NOTE — Progress Notes (Signed)
Postpartum Progress Note  S: No complaints. Feeling well. Lochia appropriate. No subjective fevers/chills, Cp, or SOB. Awaiting foley removal.  O:  Vitals:   03/02/22 0236 03/02/22 0537  BP: 120/73 117/73  Pulse: 82 84  Resp: 18 18  Temp: 98.5 F (36.9 C) 98.8 F (37.1 C)  SpO2: 100% 100%    Gen: NAD, A&O Pulm: NWOB Abd: soft, appropriately ttp, fundus firm and below Umb. Incision c/d/i Ext: No evidence of DVT, trace edema b/l  UOP adequate.   Labs Recent Results (from the past 2160 hour(s))  Urinalysis, Routine w reflex microscopic Urine, Clean Catch     Status: Abnormal   Collection Time: 01/17/22 12:47 PM  Result Value Ref Range   Color, Urine YELLOW YELLOW   APPearance HAZY (A) CLEAR   Specific Gravity, Urine 1.009 1.005 - 1.030   pH 7.0 5.0 - 8.0   Glucose, UA 150 (A) NEGATIVE mg/dL   Hgb urine dipstick NEGATIVE NEGATIVE   Bilirubin Urine NEGATIVE NEGATIVE   Ketones, ur NEGATIVE NEGATIVE mg/dL   Protein, ur NEGATIVE NEGATIVE mg/dL   Nitrite NEGATIVE NEGATIVE   Leukocytes,Ua SMALL (A) NEGATIVE   RBC / HPF 0-5 0 - 5 RBC/hpf   WBC, UA 0-5 0 - 5 WBC/hpf   Bacteria, UA RARE (A) NONE SEEN   Squamous Epithelial / LPF 6-10 0 - 5   Mucus PRESENT     Comment: Performed at [facility], [address] [place], [place] [address]  Culture, [place] Urine     Status: None   Collection Time: 01/17/22 12:47 PM   Specimen: Urine, Random  Result Value Ref Range   Specimen Description URINE, RANDOM    Special Requests ADDED 2246 01/17/2022    Culture      NO GROWTH Performed at [facility], [place] [address], [place], [place] [address]    Report Status 01/18/2022 FINAL   Wet prep, genital     Status: Abnormal   Collection Time: 01/17/22  1:05 PM  Result Value Ref Range   Yeast Wet Prep HPF POC NONE SEEN NONE SEEN   Trich, Wet Prep NONE SEEN NONE SEEN   Clue Cells Wet Prep HPF POC NONE SEEN NONE SEEN   WBC, Wet Prep HPF POC >=10 (A) <10   Sperm NONE SEEN      Comment: Performed at [facility], [place] [address], [place], [place] [address]  GC/Chlamydia probe amp ([place])not at [facility]     Status: None   Collection Time: 01/17/22  1:07 PM  Result Value Ref Range   Neisseria Gonorrhea Negative    Chlamydia Negative    Comment Normal Reference [place] Chlamydia - Negative    Comment      Normal Reference Range Neisseria Gonorrhea - Negative  OB RESULT CONSOLE Group B Strep     Status: None   Collection Time: 02/18/22 12:00 AM  Result Value Ref Range   GBS Negative   Type and screen [place]     Status: None   Collection Time: 03/01/22  9:46 AM  Result Value Ref Range   ABO/RH(D) A POS    Antibody Screen NEG    Sample Expiration      03/04/2022,2359 Performed at [facility], [place] [address], [place], [place] [address]   CBC     Status: None   Collection Time: 03/01/22  9:47 AM  Result Value Ref Range   WBC 8.4 4.0 -  10.5 K/uL   RBC 4.17 3.87 - 5.11 MIL/uL   Hemoglobin 12.9 12.0 - 15.0 g/dL   HCT 37.7 36.0 - 46.0 %   MCV 90.4 80.0 - 100.0 fL   MCH 30.9 26.0 - 34.0 pg   MCHC 34.2 30.0 - 36.0 g/dL   RDW 14.5 11.5 - 15.5 %   Platelets 233 150 - 400 K/uL   nRBC 0.0 0.0 - 0.2 %    Comment: Performed at [facility], [place] [address] [place], [place] [address]  RPR     Status: None   Collection Time: 03/01/22  9:47 AM  Result Value Ref Range   RPR Ser Ql NON REACTIVE NON REACTIVE    Comment: Performed at [facility], [place] [address], [place], [place] [address]  POCT fern test     Status: Abnormal   Collection Time: 03/01/22  8:50 PM  Result Value Ref Range   POCT Fern Test Positive = ruptured amniotic membanes   CBC     Status: Abnormal   Collection Time: 03/02/22  5:49 AM  Result Value Ref Range   WBC 15.2 (H) 4.0 - 10.5 K/uL   RBC 3.78 (L) 3.87 - 5.11 MIL/uL   Hemoglobin 11.8 (L) 12.0 - 15.0 g/dL   HCT 34.2 (L) 36.0 - 46.0 %   MCV 90.5 80.0 - 100.0 fL   MCH 31.2 26.0 - 34.0 pg   MCHC  34.5 30.0 - 36.0 g/dL   RDW 14.3 11.5 - 15.5 %   Platelets 227 150 - 400 K/uL   nRBC 0.0 0.0 - 0.2 %    Comment: Performed at [facility], [place] [address] [place], [place] [address]     A/P:  POD1 s/p pCS for breech presentation and SROM/active labor, doing well pp. Postop hemoglobin 11.8. AFVSS. Benign exam. Awaiting foley removal - will f/u voids after. Has fibroid uterus. Continue present care.  Plan for d/c POD#2 or 3. Desires circ, baby not feeding well yet - will perform prior to dc.  Hurshel Party, MD

## 2022-03-02 NOTE — Lactation Note (Signed)
This note was copied from a baby's chart. Lactation Consultation Note  Patient Name: Bonnie Friedman [id number] Date: 03/02/2022 Reason for consult: Mother's request;Primapara;Early term 28-38.6wks Age:32 hours Mom stated the baby is cluster feeding and she thinks he needs something to be supplemented with. Formula is only available tonight and mom is fine with that. Hand expression demonstrated mom has some colostrum. Explained normal not to get anything when pumping the first 2 days. Encouraged hand expression and can give colostrum that way.  Answered mom's questions. Encouraged to call for assistance as needed.  Maternal Data Has patient been taught Hand Expression?: Yes Does the patient have breastfeeding experience prior to this delivery?: No  Feeding Mother's Current Feeding Choice: Breast Milk and Formula Nipple Type: Slow - flow  LATCH Score       Type of Nipple: Everted at rest and after stimulation  Comfort (Breast/Nipple): Soft / non-tender         Lactation Tools Discussed/Used Tools: Pump;Flanges Flange Size: 21 Breast pump type: Double-Electric Breast Pump Pump Education: Setup, frequency, and cleaning;Milk Storage Reason for Pumping: stimulation Pumping frequency: q3h  Interventions Interventions: DEBP;Education;Pace feeding;Breast massage;Hand express;Breast compression  Discharge    Consult Status Consult Status: Follow-up Date: 03/03/22 Follow-up type: In-patient    Theodoro Kalata 03/02/2022, 10:55 PM

## 2022-03-03 ENCOUNTER — Inpatient Hospital Stay (HOSPITAL_COMMUNITY)
Admission: RE | Admit: 2022-03-03 | Payer: Managed Care, Other (non HMO) | Source: Home / Self Care | Admitting: Obstetrics and Gynecology

## 2022-03-03 MED ORDER — ACETAMINOPHEN 500 MG PO TABS
1000.0000 mg | ORAL_TABLET | Freq: Four times a day (QID) | ORAL | Status: DC
  Administered 2022-03-03 – 2022-03-04 (×3): 1000 mg via ORAL
  Filled 2022-03-03 (×4): qty 2

## 2022-03-03 MED ORDER — HYDROMORPHONE HCL 1 MG/ML IJ SOLN
1.0000 mg | INTRAMUSCULAR | Status: DC | PRN
  Administered 2022-03-03 (×2): 1 mg via INTRAVENOUS
  Filled 2022-03-03 (×2): qty 1

## 2022-03-03 MED ORDER — ACETAMINOPHEN 500 MG PO TABS
1000.0000 mg | ORAL_TABLET | Freq: Four times a day (QID) | ORAL | Status: DC
  Administered 2022-03-03: 1000 mg via ORAL
  Filled 2022-03-03: qty 2

## 2022-03-03 NOTE — Progress Notes (Signed)
Subjective: Postpartum Day 2: Cesarean Delivery Patient reports incisional pain, tolerating PO, and no problems voiding.   Last pain med was at 11pm last night. Slept well but at 0800 had increased pain when tried to get up for first time. She has not yet passed flatus.   Objective: Vital signs in last 24 hours: Temp:  [97.8 F (36.6 C)-99.6 F (37.6 C)] 97.8 F (36.6 C) (10/19 0403) Pulse Rate:  [75-90] 90 (10/19 0403) Resp:  [17-18] 17 (10/19 0403) BP: (103-111)/(64-76) 104/69 (10/19 0403) SpO2:  [100 %] 100 % (10/19 0403)  Physical Exam:  General: alert and cooperative Lochia: appropriate Uterine Fundus: firm and fibroid Abd: appropriately tender, mild distention, + bs Incision: healing well, no significant drainage, no dehiscence, no significant erythema DVT Evaluation: No evidence of DVT seen on physical exam.  Recent Labs    03/01/22 0947 03/02/22 0549  HGB 12.9 11.8*  HCT 37.7 34.2*    Assessment/Plan: Status post Cesarean section. POD#2 s/p pCS breech/fibroids.  Stable postop.Exam overall reassuring.  Will schedule non-narcotic pain meds rather than having them be prn. Encouraged ambulation to assist with passing flatus.  D/W patient female infant circumcision, risks/benefits reviewed. All questions answered.   Tyson Dense, MD 03/03/2022, 8:35 AM

## 2022-03-03 NOTE — Progress Notes (Signed)
Pt distended,hypoactive BS, reports just now some flatus but still very uncomfortable, knee to chest exercises performed with this RN ,Dilaudid '1mg'$  IV administered, and warmed apple/prune juice given. Pt was walking in hall earlier. Incision WNL,scant dng on left side. New Night RN to take over care

## 2022-03-03 NOTE — Progress Notes (Signed)
Pt brought in a cord blood kit from home for stem cell banking and was requesting for blood to be drawn in the blood tubes that were provided in the kit.  Spoke to Dr Royston Sinner who gave a verbal order for a venipuncture stick at bedside to collect the tubes of blood.  However, when I attempted to place the order in the computer, I was unable to find one.  Called main lab and was informed per their executive director that they can no longer draw blood for kits that are brought into the hospital.  Informed patient.

## 2022-03-04 MED ORDER — IBUPROFEN 600 MG PO TABS: 600.0000 mg | ORAL_TABLET | Freq: Four times a day (QID) | ORAL | 0 refills | Status: DC | PRN

## 2022-03-04 MED ORDER — ACETAMINOPHEN 500 MG PO TABS: 1000.0000 mg | ORAL_TABLET | Freq: Four times a day (QID) | ORAL | 0 refills | Status: DC | PRN

## 2022-03-04 MED ORDER — OXYCODONE HCL 5 MG PO TABS: 5.0000 mg | ORAL_TABLET | Freq: Four times a day (QID) | ORAL | 0 refills | Status: DC | PRN

## 2022-03-04 NOTE — Lactation Note (Signed)
This note was copied from a baby's chart. Lactation Consultation Note  Patient Name: Bonnie Friedman [id number]'Y Date: 03/04/2022 Reason for consult: Primapara;Early term 37-38.6wks Age:32 hours  Mom's preference when pregnant was to pump & BO, but she was led to believe that she wouldn't make as much milk if she were to do so. I informed Mom that she could still have a great supply if she were to pump & BO.   Mom only pumped twice yesterday & had not pumped today. I encouraged Mom to go ahead and pump. Size 21 flanges were appropriate & she was able to get 10 mL. Mom was pleased was thankful b/c now she felt that it was "possible."  I made Mom aware of [facility] & gave her the phone # to call for post-discharge questions.   Maternal Data Has patient been taught Hand Expression?: Yes Does the patient have breastfeeding experience prior to this delivery?: No  Feeding Mother's Current Feeding Choice: Breast Milk and Formula   Lactation Tools Discussed/Used Tools: Pump Flange Size: 21 Breast pump type: Double-Electric Breast Pump Pumping frequency: Mom encouraged to pump when infant receives formula Pumped volume: 10 mL  Interventions Interventions: Education;DEBP  Discharge Pump: Personal;Hands Free  Consult Status Consult Status: Complete    Larkin Ina 03/04/2022, 2:16 PM

## 2022-03-04 NOTE — Discharge Summary (Signed)
Postpartum Discharge Summary  Date of Service updated 03/04/22     Patient Name: Bonnie Friedman DOB: 1991/05/02 MRN: [id number]  Date of admission: 03/01/2022 Delivery date:03/01/2022  Delivering provider: Dian Queen  Date of discharge: 03/04/2022  Admitting diagnosis: Breech birth [O32.1XX0] S/P cesarean section [Z98.891] Intrauterine pregnancy: [redacted]w[redacted]d    Secondary diagnosis:  Principal Problem:   Breech birth Active Problems:   S/P cesarean section  Additional problems: fibroids    Discharge diagnosis: Term Pregnancy Delivered                                              Post partum procedures: Augmentation: N/A Complications: None  Hospital course: Onset of Labor With Unplanned C/S   31y.o. yo G1P1001 at 32w3das admitted in [facility] 03/01/2022. Patient had a labor course significant for fibroids. The patient went for cesarean section due to Malpresentation. Delivery details as follows: Membrane Rupture Time/Date: 7:30 PM ,03/01/2022   Delivery Method:C-Section, Low Transverse  Details of operation can be found in separate operative note. Patient had a postpartum course complicated by.  She is ambulating,tolerating a regular diet, passing flatus, and urinating well.  Patient is discharged home in stable condition 03/04/22.  Newborn Data: Birth date:03/01/2022  Birth time:11:01 PM  Gender:Female  Living status:Living  Apgars:8 ,9  Weight:4010 g   Magnesium Sulfate received: No BMZ received: No Rhophylac:No MMR:No T-DaP:Given prenatally Flu: No Transfusion:No  Physical exam  Vitals:   03/03/22 1446 03/03/22 2103 03/04/22 0544 03/04/22 0900  BP: 113/70 100/69 114/75 103/68  Pulse: 82 96 82 92  Resp: 14 18 18    Temp: 98.2 F (36.8 C) 99.3 F (37.4 C) 98.7 F (37.1 C) 98.3 F (36.8 C)  TempSrc: Oral Oral Oral Oral  SpO2: 100% 100% 100% 99%  Weight:      Height:       General: alert, cooperative, and no distress Lochia:  appropriate Uterine Fundus: firm Incision: Healing well with no significant drainage DVT Evaluation: No evidence of DVT seen on physical exam. Labs: Lab Results  Component Value Date   WBC 15.2 (H) 03/02/2022   HGB 11.8 (L) 03/02/2022   HCT 34.2 (L) 03/02/2022   MCV 90.5 03/02/2022   PLT 227 03/02/2022       No data to display         Edinburgh Score:    03/02/2022    1:24 AM  Edinburgh Postnatal Depression Scale Screening Tool  I have been able to laugh and see the funny side of things. 0  I have looked forward with enjoyment to things. 0  I have blamed myself unnecessarily when things went wrong. 0  I have been anxious or worried for no good reason. 0  I have felt scared or panicky for no good reason. 0  Things have been getting on top of me. 0  I have been so unhappy that I have had difficulty sleeping. 0  I have felt sad or miserable. 0  I have been so unhappy that I have been crying. 0  The thought of harming myself has occurred to me. 0  Edinburgh Postnatal Depression Scale Total 0      After visit meds:  Allergies as of 03/04/2022       Reactions   Penicillins Itching        Medication  List     STOP taking these medications    aspirin EC 81 MG tablet   methocarbamol 750 MG tablet Commonly known as: ROBAXIN   oxyCODONE-acetaminophen 7.5-325 MG tablet Commonly known as: PERCOCET   valACYclovir 500 MG tablet Commonly known as: VALTREX       TAKE these medications    acetaminophen 500 MG tablet Commonly known as: TYLENOL Take 2 tablets (1,000 mg total) by mouth every 6 (six) hours as needed.   ibuprofen 600 MG tablet Commonly known as: ADVIL Take 1 tablet (600 mg total) by mouth every 6 (six) hours as needed.   oxyCODONE 5 MG immediate release tablet Commonly known as: Oxy IR/ROXICODONE Take 1 tablet (5 mg total) by mouth every 6 (six) hours as needed for moderate pain.   prenatal multivitamin Tabs tablet Take 1 tablet by mouth  daily.         Discharge home in stable condition Infant Feeding: Breast Infant Disposition:home with mother Discharge instruction: per After Visit Summary and Postpartum booklet. Activity: Advance as tolerated. Pelvic rest for 6 weeks.  Diet: routine diet Anticipated Birth Control: Unsure Postpartum Appointment:6 weeks Additional Postpartum F/U:  Future Appointments:No future appointments. Follow up Visit:      03/04/2022 Allena Katz, MD

## 2022-03-15 ENCOUNTER — Telehealth (HOSPITAL_COMMUNITY): Payer: Self-pay | Admitting: *Deleted

## 2022-03-15 NOTE — Telephone Encounter (Signed)
Hospital Discharge Follow-Up Call:  Patient reports that she is well and has no concerns about her healing process.  EPDS today was 0 and she endorses this accurately reflects that she is doing well emotionally.  Patient says that baby is well and she has no concerns about baby's health.  She reports that baby sleeps in a bassinet in parent's bedroom.  Reviewed ABCs of Safe Sleep.

## 2022-11-16 ENCOUNTER — Other Ambulatory Visit: Payer: Self-pay

## 2022-11-16 ENCOUNTER — Encounter (HOSPITAL_BASED_OUTPATIENT_CLINIC_OR_DEPARTMENT_OTHER): Payer: Self-pay | Admitting: Obstetrics and Gynecology

## 2022-11-16 DIAGNOSIS — D219 Benign neoplasm of connective and other soft tissue, unspecified: Secondary | ICD-10-CM | POA: Diagnosis not present

## 2022-11-16 DIAGNOSIS — Z01812 Encounter for preprocedural laboratory examination: Secondary | ICD-10-CM | POA: Diagnosis not present

## 2022-11-16 NOTE — H&P (Signed)
Bonnie Friedman is an 32 y.o. female. Presenting for scheduled abdominal myomectomy.  Recent US 10/2022:FIBROIDS - subserosal 7.7cm, 7.0cm, 1.9cm, 1.3cm. Intramural 5.1cm and 2.7 cm (LUS). She had a CS in 02/2022 s/s obstructing fibroids. She is on loloestrin for period control and contraception.   Pertinent Gynecological History: OB History: G1, P1  No LMP recorded.    Past Medical History:  Diagnosis Date   Genital herpes    PONV (postoperative nausea and vomiting)    Uterine fibroid     Past Surgical History:  Procedure Laterality Date   CESAREAN SECTION N/A 03/01/2022   Procedure: PRIMARY CESAREAN SECTION  EDC: 03-12-22 ALLERG: PENICILLINS;  Surgeon: Marcelle Overlie, MD;  Location: [facility] LD ORS;  Service: Obstetrics;  Laterality: N/A;   KNEE ARTHROSCOPY Left    TYMPANOSTOMY TUBE PLACEMENT      Family History  Problem Relation Age of Onset   Stroke Father    Multiple sclerosis Father    Diabetes Maternal Grandmother     Social History:  reports that she has never smoked. She has never used smokeless tobacco. She reports that she does not currently use alcohol. She reports that she does not currently use drugs.  Allergies:  Allergies  Allergen Reactions   Penicillins Itching    No medications prior to admission.    Review of Systems  unknown if currently breastfeeding. Physical Exam Gen: well appearing, NAD CV: Reg rate Pulm: NWOB Abd: soft, nondistended, nontender, no masses GYN: uterus enlarged, fibroids, irregular contour, no adnexa ttp/CMT Ext: No edema b/l  No results found for this or any previous visit (from the past 24 hour(s)).  No results found.  Assessment/Plan: 32 yo G1P1 s/p CS for obstructing fibroids presents for scheduled MMY. Risks discussed including infection, bleeding, damage to surrounding structures, need for additional procedures, hysterectomy, need for CS in the future, postoperative DVT and unexpected pathogy. All questions answered.  Consent signed in office. Gent/clinda on call to OR - PCN allergy (itching).    Bonnie Friedman 11/16/2022, 3:20 PM

## 2022-11-16 NOTE — Progress Notes (Signed)
Spoke w/ via phone for pre-op interview---[facility] Lab needs dos---- urine pregnancy              Lab results------12/09/22 lab appt for cbc, type & screen, bmp COVID test -----patient states asymptomatic no test needed Arrive at -------0530 on Tuesday, 12/13/22 NPO after MN NO Solid Food.  Clear liquids from MN until---0430 Med rec completed Medications to take morning of surgery -----BCP, Valtrex prn Diabetic medication -----n/a Patient instructed no nail polish to be worn day of surgery Patient instructed to bring photo id and insurance card day of surgery Patient aware to have Driver (ride ) / caregiver    for 24 hours after surgery - mom, [place] Patient Special Instructions -----Extended / overnight stay instructions given. Pre-Op special Instructions -----none Patient verbalized understanding of instructions that were given at this phone interview. Patient denies shortness of breath, chest pain, fever, cough at this phone interview.

## 2022-11-16 NOTE — Progress Notes (Signed)
Your procedure is scheduled on Tuesday, 12/13/22.  Report to [facility] AT  5:30 AM.   Call this number if you have problems the morning of surgery  :[phone].   OUR ADDRESS IS [address].  WE ARE LOCATED IN [facility].  PLEASE BRING YOUR INSURANCE CARD AND PHOTO ID DAY OF SURGERY.  ONLY 2 PEOPLE ARE ALLOWED IN  WAITING  ROOM                                      REMEMBER:  DO NOT EAT FOOD, CANDY GUM OR MINTS  AFTER MIDNIGHT THE NIGHT BEFORE YOUR SURGERY . YOU MAY HAVE CLEAR LIQUIDS FROM MIDNIGHT THE NIGHT BEFORE YOUR SURGERY UNTIL  4:30 AM. NO CLEAR LIQUIDS AFTER   4:30 AM DAY OF SURGERY.  YOU MAY  BRUSH YOUR TEETH MORNING OF SURGERY AND RINSE YOUR MOUTH OUT, NO CHEWING GUM CANDY OR MINTS.     CLEAR LIQUID DIET    Allowed      Water                                                                   Coffee and tea, regular and decaf  (NO cream or milk products of any type, may sweeten)                         Carbonated beverages, regular and diet                                    Sports drinks like Gatorade _____________________________________________________________________     TAKE ONLY THESE MEDICATIONS MORNING OF SURGERY: Birth control pill, Valtrex if needed                                        DO NOT WEAR JEWERLY/  METAL/  PIERCINGS (INCLUDING NO PLASTIC PIERCINGS) DO NOT WEAR LOTIONS, POWDERS, PERFUMES OR NAIL POLISH ON YOUR FINGERNAILS. TOENAIL POLISH IS OK TO WEAR. DO NOT SHAVE FOR 48 HOURS PRIOR TO DAY OF SURGERY.  CONTACTS, GLASSES, OR DENTURES MAY NOT BE WORN TO SURGERY.  REMEMBER: NO SMOKING, VAPING ,  DRUGS OR ALCOHOL FOR 24 HOURS BEFORE YOUR SURGERY.                                    [facility] IS NOT RESPONSIBLE  FOR ANY BELONGINGS.                                                                    Marland Kitchen           [place] -  Preparing for Surgery Before surgery, you can play an important role.   Because skin is not sterile, your skin needs to be as free of germs as possible.  You can reduce the number of germs on your skin by washing with CHG (chlorahexidine gluconate) soap before surgery.  CHG is an antiseptic cleaner which kills germs and bonds with the skin to continue killing germs even after washing. Please DO NOT use if you have an allergy to CHG or antibacterial soaps.  If your skin becomes reddened/irritated stop using the CHG and inform your nurse when you arrive at Short Stay. Do not shave (including legs and underarms) for at least 48 hours prior to the first CHG shower.  You may shave your face/neck. Please follow these instructions carefully:  1.  Shower with CHG Soap the night before surgery and the  morning of Surgery.  2.  If you choose to wash your hair, wash your hair first as usual with your  normal  shampoo.  3.  After you shampoo, rinse your hair and body thoroughly to remove the  shampoo.                                        4.  Use CHG as you would any other liquid soap.  You can apply chg directly  to the skin and wash , chg soap provided, night before and morning of your surgery.  5.  Apply the CHG Soap to your body ONLY FROM THE NECK DOWN.   Do not use on face/ open                           Wound or open sores. Avoid contact with eyes, ears mouth and genitals (private parts).                       Wash face,  Genitals (private parts) with your normal soap.             6.  Wash thoroughly, paying special attention to the area where your surgery  will be performed.  7.  Thoroughly rinse your body with warm water from the neck down.  8.  DO NOT shower/wash with your normal soap after using and rinsing off  the CHG Soap.             9.  Pat yourself dry with a clean towel.            10.  Wear clean pajamas.            11.  Place clean sheets on your bed the night of your first shower and do not  sleep with pets. Day of Surgery : Do not apply any lotions/ powders  the morning of surgery.  Please wear clean clothes to the hospital/surgery center.  IF YOU HAVE ANY SKIN IRRITATION OR PROBLEMS WITH THE SURGICAL SOAP, PLEASE GET A BAR OF GOLD DIAL SOAP AND SHOWER THE NIGHT BEFORE YOUR SURGERY AND THE MORNING OF YOUR SURGERY. PLEASE LET THE NURSE KNOW MORNING OF YOUR SURGERY IF YOU HAD ANY PROBLEMS WITH THE SURGICAL SOAP.   YOUR SURGEON MAY HAVE REQUESTED EXTENDED RECOVERY TIME AFTER YOUR SURGERY. IT COULD BE A  JUST A FEW HOURS  UP TO AN OVERNIGHT STAY.  YOUR SURGEON SHOULD HAVE DISCUSSED THIS WITH  YOU PRIOR TO YOUR SURGERY. IN THE EVENT YOU NEED TO STAY OVERNIGHT PLEASE REFER TO THE FOLLOWING GUIDELINES. YOU MAY HAVE UP TO 4 VISITORS  MAY VISIT IN THE EXTENDED RECOVERY ROOM UNTIL 800 PM ONLY.  ONE  VISITOR AGE 23 AND OVER MAY SPEND THE NIGHT AND MUST BE IN EXTENDED RECOVERY ROOM NO LATER THAN 800 PM . YOUR DISCHARGE TIME AFTER YOU SPEND THE NIGHT IS 900 AM THE MORNING AFTER YOUR SURGERY. YOU MAY PACK A SMALL OVERNIGHT BAG WITH TOILETRIES FOR YOUR OVERNIGHT STAY IF YOU WISH.  REGARDLESS OF IF YOU STAY OVER NIGHT OR ARE DISCHARGED THE SAME DAY YOU WILL BE REQUIRED TO HAVE A RESPONSIBLE ADULT (18 YRS OLD OR OLDER) STAY WITH YOU FOR AT LEAST THE FIRST 24 HOURS  YOUR PRESCRIPTION MEDICATIONS WILL BE PROVIDED DURING YOUR HOSPITAL STAY.  ________________________________________________________________________                                                        QUESTIONS Mechele Claude PRE OP NURSE PHONE [phone].

## 2022-12-08 NOTE — H&P (Signed)
Bonnie Friedman is an 32 y.o. female. Presenting for abdominal myomectomy. She has a history of large fibroids.  TVUS 08/2022:  7.2cm subserosal Intramural: 5.9 x 5.1cm, 4 x 4 xm, 2.1cm. She had a CS on 03/01/2022 for large obstructing fibroids.  She takes OCP for contraception.   Menstrual History: Patient's last menstrual period was 11/14/2022 (exact date).    Past Medical History:  Diagnosis Date   Anxiety    no meds   Genital herpes    PONV (postoperative nausea and vomiting)    Uterine fibroid    Wears contact lenses    Wears glasses     Past Surgical History:  Procedure Laterality Date   CESAREAN SECTION N/A 03/01/2022   Procedure: PRIMARY CESAREAN SECTION  EDC: 03-12-22 ALLERG: PENICILLINS;  Surgeon: Marcelle Overlie, MD;  Location: [facility] LD ORS;  Service: Obstetrics;  Laterality: N/A;   KNEE ARTHROSCOPY Left 06/2020   TYMPANOSTOMY TUBE PLACEMENT     as a child    Family History  Problem Relation Age of Onset   Stroke Father    Multiple sclerosis Father    Diabetes Maternal Grandmother     Social History:  reports that she has never smoked. She has never used smokeless tobacco. She reports current alcohol use of about 2.0 standard drinks of alcohol per week. She reports that she does not use drugs.  Allergies:  Allergies  Allergen Reactions   Penicillins Itching    No medications prior to admission.    Review of Systems  Height 5' 5.5" (1.664 m), weight 79.4 kg, last menstrual period 11/14/2022, not currently breastfeeding. Physical Exam Gen: well appearing, NAD CV: Reg rate Pulm: NWOB Abd: soft, nondistended, nontender, no mases GYN: uterus 14 week size, no adnexa ttp/CMT Ext: No edema b/l   No results found for this or any previous visit (from the past 24 hour(s)).  No results found.  Assessment/Plan: 32 yo presenting for scheduled abdominal MMY.  Risks discussed including infection, bleeding, damage to surrounding structures, need for additional  procedures, postoperative DVT, hysterectomy, unexpected pathology, need for CS in the future, etc. All questions answered. Consent signed in office. 2g ancef on call to OR. Mild PCN allergy.  Continue loloestrin.  Of note, patient reports low pain tolerance and the only medication that works for her is hydrocodone. This was already rx-ed fo rpostop.    [name] Halley Kincer 12/08/2022, 9:04 AM

## 2022-12-09 ENCOUNTER — Encounter (HOSPITAL_COMMUNITY)
Admission: RE | Admit: 2022-12-09 | Discharge: 2022-12-09 | Disposition: A | Discharge status: Home / Self Care | Payer: Managed Care, Other (non HMO) | Source: Ambulatory Visit | Attending: Obstetrics and Gynecology

## 2022-12-09 DIAGNOSIS — Z01812 Encounter for preprocedural laboratory examination: Secondary | ICD-10-CM | POA: Diagnosis not present

## 2022-12-09 DIAGNOSIS — Z01818 Encounter for other preprocedural examination: Secondary | ICD-10-CM

## 2022-12-09 DIAGNOSIS — D219 Benign neoplasm of connective and other soft tissue, unspecified: Secondary | ICD-10-CM

## 2022-12-09 LAB — CBC
HCT: 38.4 % (ref 36.0–46.0)
Hemoglobin: 12.7 g/dL (ref 12.0–15.0)
MCH: 30 pg (ref 26.0–34.0)
MCHC: 33.1 g/dL (ref 30.0–36.0)
MCV: 90.6 fL (ref 80.0–100.0)
Platelets: 266 10*3/uL (ref 150–400)
RBC: 4.24 MIL/uL (ref 3.87–5.11)
RDW: 12.6 % (ref 11.5–15.5)
WBC: 6.4 10*3/uL (ref 4.0–10.5)
nRBC: 0 % (ref 0.0–0.2)

## 2022-12-09 LAB — BASIC METABOLIC PANEL
Anion gap: 8 (ref 5–15)
BUN: 9 mg/dL (ref 6–20)
CO2: 24 mmol/L (ref 22–32)
Calcium: 8.9 mg/dL (ref 8.9–10.3)
Chloride: 106 mmol/L (ref 98–111)
Creatinine, Ser: 0.67 mg/dL (ref 0.44–1.00)
GFR, Estimated: >60 mL/min (ref >60)
Glucose, Bld: 84 mg/dL (ref 70–99)
Potassium: 3.4 mmol/L — ABNORMAL LOW (ref 3.5–5.1)
Sodium: 138 mmol/L (ref 135–145)

## 2022-12-09 LAB — TYPE AND SCREEN
ABO/RH(D): A POS
Antibody Screen: NEGATIVE

## 2022-12-12 MED ORDER — GENTAMICIN SULFATE 40 MG/ML IJ SOLN
5.0000 mg/kg | INTRAVENOUS | Status: AC
  Administered 2022-12-13: 330 mg via INTRAVENOUS
  Filled 2022-12-12: qty 8.25

## 2022-12-12 NOTE — Anesthesia Preprocedure Evaluation (Addendum)
Anesthesia Evaluation  Patient identified by MRN, date of birth, ID band Patient awake    Reviewed: Allergy & Precautions, NPO status , Patient's Chart, lab work & pertinent test results  History of Anesthesia Complications (+) PONV and history of anesthetic complications  Airway Mallampati: II  TM Distance: >3 FB Neck ROM: Full    Dental  (+) Teeth Intact, Dental Advisory Given Temporary crown upper left molar:   Pulmonary neg pulmonary ROS   Pulmonary exam normal breath sounds clear to auscultation       Cardiovascular negative cardio ROS Normal cardiovascular exam Rhythm:Regular Rate:Normal     Neuro/Psych  PSYCHIATRIC DISORDERS Anxiety     negative neurological ROS     GI/Hepatic negative GI ROS, Neg liver ROS,,,  Endo/Other  Obesity BMI 30  Renal/GU negative Renal ROS  negative genitourinary   Musculoskeletal negative musculoskeletal ROS (+)    Abdominal   Peds  Hematology negative hematology ROS (+) Hb 12.7   Anesthesia Other Findings   Reproductive/Obstetrics Fibroids, dyspareunia                               Anesthesia Physical Anesthesia Plan  ASA: 2  Anesthesia Plan: General   Post-op Pain Management: Tylenol PO (pre-op)*, Toradol IV (intra-op)*, Ketamine IV*, Dilaudid IV and Precedex   Induction: Intravenous  PONV Risk Score and Plan: 4 or greater and Ondansetron, Dexamethasone, Midazolam and Scopolamine patch - Pre-op  Airway Management Planned: Oral ETT  Additional Equipment: None  Intra-op Plan:   Post-operative Plan: Extubation in OR  Informed Consent: I have reviewed the patients History and Physical, chart, labs and discussed the procedure including the risks, benefits and alternatives for the proposed anesthesia with the patient or authorized representative who has indicated his/her understanding and acceptance.     Dental advisory given  Plan  Discussed with: CRNA  Anesthesia Plan Comments: (Will pad left teeth with gauze on wakeup to prevent damage to temporary crown)        Anesthesia Quick Evaluation

## 2022-12-13 ENCOUNTER — Observation Stay (HOSPITAL_BASED_OUTPATIENT_CLINIC_OR_DEPARTMENT_OTHER)
Admission: RE | Admit: 2022-12-13 | Discharge: 2022-12-14 | Disposition: A | Discharge status: Home / Self Care | Payer: Managed Care, Other (non HMO) | Attending: Obstetrics and Gynecology | Admitting: Obstetrics and Gynecology

## 2022-12-13 ENCOUNTER — Other Ambulatory Visit: Payer: Self-pay

## 2022-12-13 ENCOUNTER — Observation Stay (HOSPITAL_BASED_OUTPATIENT_CLINIC_OR_DEPARTMENT_OTHER): Payer: Managed Care, Other (non HMO) | Admitting: Anesthesiology

## 2022-12-13 ENCOUNTER — Encounter (HOSPITAL_BASED_OUTPATIENT_CLINIC_OR_DEPARTMENT_OTHER)
Admission: RE | Disposition: A | Discharge status: Home / Self Care | Payer: Self-pay | Source: Home / Self Care | Attending: Obstetrics and Gynecology

## 2022-12-13 ENCOUNTER — Encounter (HOSPITAL_BASED_OUTPATIENT_CLINIC_OR_DEPARTMENT_OTHER): Payer: Self-pay | Admitting: Obstetrics and Gynecology

## 2022-12-13 DIAGNOSIS — N941 Unspecified dyspareunia: Secondary | ICD-10-CM | POA: Insufficient documentation

## 2022-12-13 DIAGNOSIS — Z01818 Encounter for other preprocedural examination: Secondary | ICD-10-CM

## 2022-12-13 DIAGNOSIS — D500 Iron deficiency anemia secondary to blood loss (chronic): Secondary | ICD-10-CM | POA: Diagnosis not present

## 2022-12-13 DIAGNOSIS — D259 Leiomyoma of uterus, unspecified: Secondary | ICD-10-CM | POA: Diagnosis present

## 2022-12-13 DIAGNOSIS — D219 Benign neoplasm of connective and other soft tissue, unspecified: Principal | ICD-10-CM | POA: Diagnosis present

## 2022-12-13 HISTORY — DX: Anxiety disorder, unspecified: F41.9

## 2022-12-13 HISTORY — PX: MYOMECTOMY: SHX85

## 2022-12-13 HISTORY — DX: Presence of spectacles and contact lenses: Z97.3

## 2022-12-13 LAB — POCT PREGNANCY, URINE: Preg Test, Ur: NEGATIVE

## 2022-12-13 SURGERY — MYOMECTOMY, ABDOMINAL APPROACH
Anesthesia: General | Site: Pelvis

## 2022-12-13 MED ORDER — PANTOPRAZOLE SODIUM 40 MG PO TBEC
40.0000 mg | DELAYED_RELEASE_TABLET | Freq: Every day | ORAL | Status: DC
  Administered 2022-12-13: 40 mg via ORAL

## 2022-12-13 MED ORDER — VASOPRESSIN 20 UNIT/ML IV SOLN
INTRAVENOUS | Status: DC | PRN
  Administered 2022-12-13: 4 mL via INTRAMUSCULAR
  Administered 2022-12-13: 6 mL via INTRAMUSCULAR
  Administered 2022-12-13: 1 mL via INTRAMUSCULAR

## 2022-12-13 MED ORDER — LIDOCAINE HCL (PF) 2 % IJ SOLN
INTRAMUSCULAR | Status: DC | PRN
  Administered 2022-12-13: 1.5 mg/kg/h via INTRADERMAL

## 2022-12-13 MED ORDER — SUGAMMADEX SODIUM 200 MG/2ML IV SOLN
INTRAVENOUS | Status: DC | PRN
  Administered 2022-12-13: 200 mg via INTRAVENOUS

## 2022-12-13 MED ORDER — ACETAMINOPHEN 500 MG PO TABS
1000.0000 mg | ORAL_TABLET | Freq: Once | ORAL | Status: AC
  Administered 2022-12-13: 1000 mg via ORAL

## 2022-12-13 MED ORDER — HYDROCODONE-ACETAMINOPHEN 5-325 MG PO TABS
ORAL_TABLET | ORAL | Status: AC
  Filled 2022-12-13: qty 1

## 2022-12-13 MED ORDER — HYDROMORPHONE HCL 2 MG/ML IJ SOLN
INTRAMUSCULAR | Status: AC
  Filled 2022-12-13: qty 1

## 2022-12-13 MED ORDER — SCOPOLAMINE 1 MG/3DAYS TD PT72
1.0000 | MEDICATED_PATCH | TRANSDERMAL | Status: DC
  Administered 2022-12-13: 1.5 mg via TRANSDERMAL

## 2022-12-13 MED ORDER — ACETAMINOPHEN 325 MG PO TABS: 650.0000 mg | ORAL_TABLET | ORAL | Status: DC | PRN

## 2022-12-13 MED ORDER — GLYCOPYRROLATE 0.2 MG/ML IJ SOLN
INTRAMUSCULAR | Status: DC | PRN
  Administered 2022-12-13: .1 mg via INTRAVENOUS

## 2022-12-13 MED ORDER — MIDAZOLAM HCL 5 MG/5ML IJ SOLN
INTRAMUSCULAR | Status: DC | PRN
  Administered 2022-12-13: 2 mg via INTRAVENOUS

## 2022-12-13 MED ORDER — FENTANYL CITRATE (PF) 100 MCG/2ML IJ SOLN
INTRAMUSCULAR | Status: DC | PRN
  Administered 2022-12-13: 50 ug via INTRAVENOUS
  Administered 2022-12-13: 25 ug via INTRAVENOUS
  Administered 2022-12-13: 50 ug via INTRAVENOUS
  Administered 2022-12-13: 25 ug via INTRAVENOUS

## 2022-12-13 MED ORDER — VALACYCLOVIR HCL 500 MG PO TABS
500.0000 mg | ORAL_TABLET | Freq: Two times a day (BID) | ORAL | Status: DC
  Administered 2022-12-13 (×2): 500 mg via ORAL
  Filled 2022-12-13 (×3): qty 1

## 2022-12-13 MED ORDER — PROPOFOL 10 MG/ML IV BOLUS
INTRAVENOUS | Status: DC | PRN
Start: 2022-12-13 — End: 2022-12-13
  Administered 2022-12-13: 200 mg via INTRAVENOUS

## 2022-12-13 MED ORDER — BUPIVACAINE HCL 0.25 % IJ SOLN
INTRAMUSCULAR | Status: DC | PRN
  Administered 2022-12-13: 10 mL

## 2022-12-13 MED ORDER — ONDANSETRON HCL 4 MG/2ML IJ SOLN: 4.0000 mg | Freq: Four times a day (QID) | INTRAMUSCULAR | Status: DC | PRN

## 2022-12-13 MED ORDER — KETAMINE HCL 50 MG/5ML IJ SOSY
PREFILLED_SYRINGE | INTRAMUSCULAR | Status: AC
  Filled 2022-12-13: qty 5

## 2022-12-13 MED ORDER — AMISULPRIDE (ANTIEMETIC) 5 MG/2ML IV SOLN: 10.0000 mg | Freq: Once | INTRAVENOUS | Status: DC | PRN

## 2022-12-13 MED ORDER — ONDANSETRON HCL 4 MG/2ML IJ SOLN: 4.0000 mg | Freq: Once | INTRAMUSCULAR | Status: DC | PRN

## 2022-12-13 MED ORDER — ROCURONIUM BROMIDE 10 MG/ML (PF) SYRINGE
PREFILLED_SYRINGE | INTRAVENOUS | Status: DC | PRN
  Administered 2022-12-13: 60 mg via INTRAVENOUS

## 2022-12-13 MED ORDER — GLYCOPYRROLATE PF 0.2 MG/ML IJ SOSY
PREFILLED_SYRINGE | INTRAMUSCULAR | Status: AC
  Filled 2022-12-13: qty 1

## 2022-12-13 MED ORDER — KETOROLAC TROMETHAMINE 30 MG/ML IJ SOLN
INTRAMUSCULAR | Status: AC
  Filled 2022-12-13: qty 1

## 2022-12-13 MED ORDER — HYDROMORPHONE HCL 1 MG/ML IJ SOLN
INTRAMUSCULAR | Status: AC
  Filled 2022-12-13: qty 1

## 2022-12-13 MED ORDER — DEXMEDETOMIDINE HCL IN NACL 80 MCG/20ML IV SOLN
INTRAVENOUS | Status: AC
  Filled 2022-12-13: qty 20

## 2022-12-13 MED ORDER — KETOROLAC TROMETHAMINE 30 MG/ML IJ SOLN
INTRAMUSCULAR | Status: DC | PRN
  Administered 2022-12-13: 30 mg via INTRAVENOUS

## 2022-12-13 MED ORDER — DOCUSATE SODIUM 100 MG PO CAPS
100.0000 mg | ORAL_CAPSULE | Freq: Two times a day (BID) | ORAL | Status: DC
  Administered 2022-12-13 (×2): 100 mg via ORAL

## 2022-12-13 MED ORDER — HYDROMORPHONE HCL 1 MG/ML IJ SOLN
INTRAMUSCULAR | Status: DC | PRN
Start: 2022-12-13 — End: 2022-12-13
  Administered 2022-12-13 (×2): .5 mg via INTRAVENOUS

## 2022-12-13 MED ORDER — ALUM & MAG HYDROXIDE-SIMETH 200-200-20 MG/5ML PO SUSP: 30.0000 mL | ORAL | Status: DC | PRN

## 2022-12-13 MED ORDER — ONDANSETRON HCL 4 MG PO TABS: 4.0000 mg | ORAL_TABLET | Freq: Four times a day (QID) | ORAL | Status: DC | PRN

## 2022-12-13 MED ORDER — CLINDAMYCIN PHOSPHATE 900 MG/50ML IV SOLN
INTRAVENOUS | Status: AC
  Filled 2022-12-13: qty 50

## 2022-12-13 MED ORDER — DEXMEDETOMIDINE HCL IN NACL 80 MCG/20ML IV SOLN
INTRAVENOUS | Status: DC | PRN
  Administered 2022-12-13: 8 ug via INTRAVENOUS
  Administered 2022-12-13: 4 ug via INTRAVENOUS

## 2022-12-13 MED ORDER — DEXAMETHASONE SODIUM PHOSPHATE 10 MG/ML IJ SOLN
INTRAMUSCULAR | Status: DC | PRN
  Administered 2022-12-13: 8 mg via INTRAVENOUS

## 2022-12-13 MED ORDER — SCOPOLAMINE 1 MG/3DAYS TD PT72
MEDICATED_PATCH | TRANSDERMAL | Status: AC
  Filled 2022-12-13: qty 1

## 2022-12-13 MED ORDER — TRANEXAMIC ACID-NACL 1000-0.7 MG/100ML-% IV SOLN
1000.0000 mg | Freq: Once | INTRAVENOUS | Status: AC
  Administered 2022-12-13: 1000 mg via INTRAVENOUS

## 2022-12-13 MED ORDER — ROCURONIUM BROMIDE 10 MG/ML (PF) SYRINGE
PREFILLED_SYRINGE | INTRAVENOUS | Status: AC
  Filled 2022-12-13: qty 10

## 2022-12-13 MED ORDER — ACETAMINOPHEN 500 MG PO TABS
ORAL_TABLET | ORAL | Status: AC
  Filled 2022-12-13: qty 2

## 2022-12-13 MED ORDER — OXYCODONE HCL 5 MG PO TABS: 5.0000 mg | ORAL_TABLET | Freq: Once | ORAL | Status: DC | PRN

## 2022-12-13 MED ORDER — ONDANSETRON HCL 4 MG/2ML IJ SOLN
INTRAMUSCULAR | Status: AC
  Filled 2022-12-13: qty 2

## 2022-12-13 MED ORDER — HYDROCODONE-ACETAMINOPHEN 5-325 MG PO TABS
ORAL_TABLET | ORAL | Status: AC
  Filled 2022-12-13: qty 2

## 2022-12-13 MED ORDER — MEPERIDINE HCL 25 MG/ML IJ SOLN: 6.2500 mg | INTRAMUSCULAR | Status: DC | PRN

## 2022-12-13 MED ORDER — DEXAMETHASONE SODIUM PHOSPHATE 10 MG/ML IJ SOLN
INTRAMUSCULAR | Status: AC
  Filled 2022-12-13: qty 1

## 2022-12-13 MED ORDER — SIMETHICONE 80 MG PO CHEW
80.0000 mg | CHEWABLE_TABLET | Freq: Four times a day (QID) | ORAL | Status: DC | PRN
  Administered 2022-12-14: 80 mg via ORAL

## 2022-12-13 MED ORDER — LACTATED RINGERS IV SOLN: INTRAVENOUS | Status: DC

## 2022-12-13 MED ORDER — KETOROLAC TROMETHAMINE 30 MG/ML IJ SOLN: 30.0000 mg | Freq: Once | INTRAMUSCULAR | Status: DC | PRN

## 2022-12-13 MED ORDER — POVIDONE-IODINE 10 % EX SWAB: 2.0000 | Freq: Once | CUTANEOUS | Status: DC

## 2022-12-13 MED ORDER — MIDAZOLAM HCL 2 MG/2ML IJ SOLN
INTRAMUSCULAR | Status: AC
  Filled 2022-12-13: qty 2

## 2022-12-13 MED ORDER — LIDOCAINE 2% (20 MG/ML) 5 ML SYRINGE
INTRAMUSCULAR | Status: DC | PRN
  Administered 2022-12-13: 60 mg via INTRAVENOUS

## 2022-12-13 MED ORDER — PROPOFOL 500 MG/50ML IV EMUL
INTRAVENOUS | Status: AC
  Filled 2022-12-13: qty 50

## 2022-12-13 MED ORDER — OXYCODONE HCL 5 MG/5ML PO SOLN: 5.0000 mg | Freq: Once | ORAL | Status: DC | PRN

## 2022-12-13 MED ORDER — KETAMINE HCL 10 MG/ML IJ SOLN
INTRAMUSCULAR | Status: DC | PRN
  Administered 2022-12-13: 30 mg via INTRAVENOUS
  Administered 2022-12-13 (×2): 10 mg via INTRAVENOUS

## 2022-12-13 MED ORDER — WHITE PETROLATUM EX OINT
TOPICAL_OINTMENT | CUTANEOUS | Status: AC
  Filled 2022-12-13: qty 5

## 2022-12-13 MED ORDER — HYDROCODONE-ACETAMINOPHEN 5-325 MG PO TABS
1.0000 | ORAL_TABLET | ORAL | Status: DC | PRN
  Administered 2022-12-13 (×2): 1 via ORAL
  Administered 2022-12-13 – 2022-12-14 (×3): 2 via ORAL

## 2022-12-13 MED ORDER — 0.9 % SODIUM CHLORIDE (POUR BTL) OPTIME
TOPICAL | Status: DC | PRN
Start: 2022-12-13 — End: 2022-12-13
  Administered 2022-12-13: 2000 mL

## 2022-12-13 MED ORDER — DOCUSATE SODIUM 100 MG PO CAPS
ORAL_CAPSULE | ORAL | Status: AC
  Filled 2022-12-13: qty 1

## 2022-12-13 MED ORDER — ONDANSETRON HCL 4 MG/2ML IJ SOLN
INTRAMUSCULAR | Status: DC | PRN
  Administered 2022-12-13: 4 mg via INTRAVENOUS

## 2022-12-13 MED ORDER — CLINDAMYCIN PHOSPHATE 900 MG/50ML IV SOLN
900.0000 mg | INTRAVENOUS | Status: AC
  Administered 2022-12-13: 900 mg via INTRAVENOUS

## 2022-12-13 MED ORDER — IBUPROFEN 200 MG PO TABS: 600.0000 mg | ORAL_TABLET | Freq: Four times a day (QID) | ORAL | Status: DC

## 2022-12-13 MED ORDER — KETOROLAC TROMETHAMINE 30 MG/ML IJ SOLN
30.0000 mg | Freq: Four times a day (QID) | INTRAMUSCULAR | Status: DC
  Administered 2022-12-13 – 2022-12-14 (×3): 30 mg via INTRAVENOUS

## 2022-12-13 MED ORDER — PANTOPRAZOLE SODIUM 40 MG PO TBEC
DELAYED_RELEASE_TABLET | ORAL | Status: AC
  Filled 2022-12-13: qty 1

## 2022-12-13 MED ORDER — FENTANYL CITRATE (PF) 250 MCG/5ML IJ SOLN
INTRAMUSCULAR | Status: AC
  Filled 2022-12-13: qty 5

## 2022-12-13 MED ORDER — HYDROMORPHONE HCL 1 MG/ML IJ SOLN
0.2500 mg | INTRAMUSCULAR | Status: DC | PRN
  Administered 2022-12-13: 0.25 mg via INTRAVENOUS

## 2022-12-13 MED ORDER — TRANEXAMIC ACID-NACL 1000-0.7 MG/100ML-% IV SOLN
INTRAVENOUS | Status: AC
  Filled 2022-12-13: qty 100

## 2022-12-13 MED ORDER — HYDROMORPHONE HCL 1 MG/ML IJ SOLN: 0.2000 mg | INTRAMUSCULAR | Status: DC | PRN

## 2022-12-13 MED ORDER — SENNOSIDES-DOCUSATE SODIUM 8.6-50 MG PO TABS: 1.0000 | ORAL_TABLET | Freq: Every evening | ORAL | Status: DC | PRN

## 2022-12-13 MED ORDER — LIDOCAINE HCL (PF) 2 % IJ SOLN
INTRAMUSCULAR | Status: AC
  Filled 2022-12-13: qty 5

## 2022-12-13 SURGICAL SUPPLY — 49 items
ADH SKN CLS APL DERMABOND .7 (GAUZE/BANDAGES/DRESSINGS) ×1
APL SKNCLS STERI-STRIP NONHPOA (GAUZE/BANDAGES/DRESSINGS) ×1
BARRIER ADHS 3X4 INTERCEED (GAUZE/BANDAGES/DRESSINGS) IMPLANT
BENZOIN TINCTURE PRP APPL 2/3 (GAUZE/BANDAGES/DRESSINGS) IMPLANT
BRR ADH 4X3 ABS CNTRL BYND (GAUZE/BANDAGES/DRESSINGS) ×1
CATH FOLEY 2WAY 3CC 8FR (CATHETERS) IMPLANT
CLOTH BEACON ORANGE TIMEOUT ST (SAFETY) ×1 IMPLANT
DERMABOND ADVANCED .7 DNX12 (GAUZE/BANDAGES/DRESSINGS) IMPLANT
DRAPE HYSTEROSCOPY (MISCELLANEOUS) IMPLANT
DRAPE WARM FLUID 44X44 (DRAPES) IMPLANT
DRSG OPSITE POSTOP 4X10 (GAUZE/BANDAGES/DRESSINGS) ×1 IMPLANT
DURAPREP 26ML APPLICATOR (WOUND CARE) ×1 IMPLANT
GAUZE PAD ABD 8X10 STRL (GAUZE/BANDAGES/DRESSINGS) IMPLANT
GAUZE SPONGE 4X4 12PLY STRL (GAUZE/BANDAGES/DRESSINGS) IMPLANT
GLOVE BIO SURGEON STRL SZ 6.5 (GLOVE) ×1 IMPLANT
GLOVE BIOGEL PI IND STRL 6.5 (GLOVE) ×1 IMPLANT
GLOVE BIOGEL PI IND STRL 7.0 (GLOVE) ×2 IMPLANT
GOWN STRL REUS W/TWL LRG LVL3 (GOWN DISPOSABLE) ×3 IMPLANT
HEMOSTAT ARISTA ABSORB 3G PWDR (HEMOSTASIS) IMPLANT
IRRIG SUCT STRYKERFLOW 2 WTIP (MISCELLANEOUS) ×1
IRRIGATION SUCT STRKRFLW 2 WTP (MISCELLANEOUS) ×1 IMPLANT
KIT TURNOVER CYSTO (KITS) ×1 IMPLANT
NDL HYPO 22X1.5 SAFETY MO (MISCELLANEOUS) ×1 IMPLANT
NEEDLE HYPO 22X1.5 SAFETY MO (MISCELLANEOUS) ×3 IMPLANT
NS IRRIG 500ML POUR BTL (IV SOLUTION) ×1 IMPLANT
PACK ABDOMINAL GYN (CUSTOM PROCEDURE TRAY) ×1 IMPLANT
PAD OB MATERNITY 4.3X12.25 (PERSONAL CARE ITEMS) ×1 IMPLANT
PROTECTOR NERVE ULNAR (MISCELLANEOUS) ×2 IMPLANT
SHEET LAVH (DRAPES) ×1 IMPLANT
SLEEVE SCD COMPRESS KNEE MED (STOCKING) ×1 IMPLANT
SPIKE FLUID TRANSFER (MISCELLANEOUS) IMPLANT
SPONGE T-LAP 18X18 ~~LOC~~+RFID (SPONGE) ×2 IMPLANT
STRIP CLOSURE SKIN 1/2X4 (GAUZE/BANDAGES/DRESSINGS) IMPLANT
SUT PLAIN 2 0 (SUTURE) ×1
SUT PLAIN ABS 2-0 CT1 27XMFL (SUTURE) ×1 IMPLANT
SUT VIC AB 0 CT1 18XCR BRD8 (SUTURE) IMPLANT
SUT VIC AB 0 CT1 27 (SUTURE) ×2
SUT VIC AB 0 CT1 27XBRD ANBCTR (SUTURE) ×2 IMPLANT
SUT VIC AB 0 CT1 36 (SUTURE) ×3 IMPLANT
SUT VIC AB 0 CT1 8-18 (SUTURE)
SUT VIC AB 2-0 SH 27 (SUTURE) ×5
SUT VIC AB 2-0 SH 27XBRD (SUTURE) ×2 IMPLANT
SUT VIC AB 4-0 PS2 18 (SUTURE) ×1 IMPLANT
SYR 10ML LL (SYRINGE) IMPLANT
SYR 5ML LL (SYRINGE) IMPLANT
SYR CONTROL 10ML LL (SYRINGE) ×1 IMPLANT
SYR TB 1ML LL NO SAFETY (SYRINGE) IMPLANT
TOWEL OR 17X24 6PK STRL BLUE (TOWEL DISPOSABLE) ×2 IMPLANT
TRAY FOLEY W/BAG SLVR 14FR LF (SET/KITS/TRAYS/PACK) ×1 IMPLANT

## 2022-12-13 NOTE — Transfer of Care (Signed)
Immediate Anesthesia Transfer of Care Note  Patient: Bonnie Friedman  Procedure(s) Performed: ABDOMINAL MYOMECTOMY (Pelvis)  Patient Location: PACU  Anesthesia Type:General  Level of Consciousness: awake and patient cooperative  Airway & Oxygen Therapy: Patient Spontanous Breathing and Patient connected to nasal cannula oxygen  Post-op Assessment: Report given to RN and Post -op Vital signs reviewed and stable  Post vital signs: Reviewed and stable  Last Vitals:  Vitals Value Taken Time  BP 135/85 12/13/22 0941  Temp 36.6 C 12/13/22 0941  Pulse 81 12/13/22 0943  Resp 11 12/13/22 0943  SpO2 100 % 12/13/22 0943  Vitals shown include unfiled device data.  Last Pain:  Vitals:   12/13/22 0600  TempSrc: Oral  PainSc: 0-No pain      Patients Stated Pain Goal: 3 (12/13/22 0600)  Complications: No notable events documented.

## 2022-12-13 NOTE — Progress Notes (Signed)
Day of Surgery Procedure(s) (LRB): ABDOMINAL MYOMECTOMY (N/A)  Subjective: Patient reports no problems voiding.  Ambulating in the halls. Pain well controlled.   Objective: I have reviewed patient's vital signs, intake and output, and medications.  General: alert and cooperative Resp: NWOB Cardio: regular rate and rhythm GI: soft, non-tender; bowel sounds normal; no masses,  no organomegaly and incision: clean, dry, and intact Extremities: extremities normal, atraumatic, no cyanosis or edema and Homans sign is negative, no sign of DVT Vaginal Bleeding: minimal  Assessment: s/p Procedure(s): ABDOMINAL MYOMECTOMY (N/A): stable and progressing well  Plan: Advance diet Encourage ambulation Advance to PO medication She is doing well. I d/w pt surgical findings .    LOS: 0 days    Ranae Pila, MD 12/13/2022, 9:48 AM

## 2022-12-13 NOTE — Anesthesia Postprocedure Evaluation (Signed)
Anesthesia Post Note  Patient: Bonnie Friedman  Procedure(s) Performed: ABDOMINAL MYOMECTOMY (Pelvis)     Patient location during evaluation: PACU Anesthesia Type: General Level of consciousness: awake and alert, oriented and patient cooperative Pain management: pain level controlled Vital Signs Assessment: post-procedure vital signs reviewed and stable Respiratory status: spontaneous breathing, nonlabored ventilation and respiratory function stable Cardiovascular status: blood pressure returned to baseline and stable Postop Assessment: no apparent nausea or vomiting Anesthetic complications: no Comments: Mild emergence delirium resolved without treatment   No notable events documented.  Last Vitals:  Vitals:   12/13/22 1015 12/13/22 1030  BP: 130/78 132/73  Pulse: 67 75  Resp: 14 14  Temp:    SpO2: 100% 98%    Last Pain:  Vitals:   12/13/22 1030  TempSrc:   PainSc: 3                  Lannie Fields

## 2022-12-13 NOTE — Op Note (Signed)
PREOPERATIVE DIAGNOSES: 1. Enlarged fibroid uterus. 2. Blood loss anemia.  POSTOPERATIVE DIAGNOSES: 1. Enlarged fibroid uterus. 2. Blood loss anemia.  PROCEDURE PERFORMED: 1. Laparotomy. 2. Myomectomy.  ANESTHESIA: General  SURGEON: Dr Belva Agee  ASSISTANT: Dr. Nilda Simmer  ESTIMATED BLOOD LOSS: see anesthesia record   URINE OUTPUT: see anethesia record, clear at the end of the procedure.   SPECIMENS: uterine fibroids  DRAINS: Foley catheter to gravity.  COMPLICATIONS: None.  FINDINGS: On bimanual exam, the patient has an enlarged, approximately 16 week sized uterus. No adnexal masses palpated. Surgically, the patient has an enlarged fibroid uterus with 6 fibroids. Both ovaries and tubes appeared within normal limit.  PROCEDURE: The patient was taken to the operating room where she was prepped and draped in the normal sterile fashion in the dorsal supine position. After the general anesthetic was found to be adequate, vagina was prepped and a intrauterine foley baloon placed. Attention turned to the abdomen and a mini-lap Pfannenstiel skin incision was made and carried down to the underlying layer of fasica. The fascia was incised in the midline and extended laterally in both directions with the Mayo scissors. The superior aspect of the fascial incision was then grasped with kocher clamps, tented up, and dissected off the underlying layer of rectus muscle bluntly. It was then dissected in the middle with the Mayo scissors. The inferior aspect of this incision was addressed in a similar manner. The rectus muscles were separated in the midline bluntly. The peritoneum was identified, tented up, and entered bluntly. The peritoneal incision was then extended superiorly and inferiorly and then extended bluntly. Next, the uterus was grasped bluntly and removed from the abdomen. The large fibroids were  identified. They were then injected with vasopressin, 20 units mixed in 30 cc of  normal saline along the serosal surface and careful to aspirate to avoid any blood vessels. 12 cc was injected total. Next, cautery was used to cut the linear incision along the top of the fibroid until fibroid fibers were seen. The edges of the myometrium was grasped with Allis clamps, tented up, and a hemostat was used to bluntly dissect around the fibroid followed by blunt dissection with a finger. The fibroid was easily and bluntly dissected out. It was also grasped with Lahey clamp to prevent traction. Once the blunt dissection of the large fibroid was complete, it was handed off to the scrub nurse.  Next, the uterine incision was then closed with first two running layers of #0 vicryl and then with a #2 Vicryl in a running baseball stitch.  This was repeated x 4. The endometrial cavity was entered (about 0.5cm - only edge). There was one small fibroid that remained. This fibroid was wrapped in the IP and had adhesions. It was deemed unsafe to removed without the likelihood of the patient needing a hysterectomy. The uterus was seen to be completely hemostatic after closure. Next, a 3 x 4 inch piece of Interceed was placed over the incision and dampened with normal saline. The uterus was then carefully returned to the abdomen and being careful not to disturb the Interceed. Next, the greater omentum was replaced over the uterus. Then the fascia was closed with #0 Vicryl in a running fashion. Next, the subcutaneous tissues was reapproximated with 2'0 plain gut in 3 interrupted stitches, and the skin was closed with #4-0 undyed Vicryl in a running subcuticular fashion. The incision was then dressed and bandaged appropriately. After the patient was cleaned, she was taken to  Recovery in stable condition and she will be followed for her immediate postoperative period during the hospital. Events d/w her mother in postop. Photos of fibroids taken.     Rosie Fate MD

## 2022-12-13 NOTE — Progress Notes (Signed)
No updates to above H&P. Patient arrived NPO and was consented in PACU. Risks again discussed, all questions answered, and consent signed. Proceed with above surgery.    Elise Leger MD  

## 2022-12-13 NOTE — Anesthesia Procedure Notes (Signed)
Procedure Name: Intubation Date/Time: 12/13/2022 7:38 AM  Performed by: Earmon Phoenix, CRNAPre-anesthesia Checklist: Patient identified, Emergency Drugs available, Suction available, Patient being monitored and Timeout performed Patient Re-evaluated:Patient Re-evaluated prior to induction Oxygen Delivery Method: Circle system utilized Preoxygenation: Pre-oxygenation with 100% oxygen Induction Type: IV induction Ventilation: Mask ventilation without difficulty Laryngoscope Size: Mac and 3 Grade View: Grade I Tube type: Oral Tube size: 7.0 mm Number of attempts: 1 Airway Equipment and Method: Stylet Placement Confirmation: ETT inserted through vocal cords under direct vision, positive ETCO2 and breath sounds checked- equal and bilateral Secured at: 21 cm Tube secured with: Tape Dental Injury: Teeth and Oropharynx as per pre-operative assessment

## 2022-12-14 ENCOUNTER — Encounter (HOSPITAL_BASED_OUTPATIENT_CLINIC_OR_DEPARTMENT_OTHER): Payer: Self-pay | Admitting: Obstetrics and Gynecology

## 2022-12-14 DIAGNOSIS — D259 Leiomyoma of uterus, unspecified: Secondary | ICD-10-CM | POA: Diagnosis not present

## 2022-12-14 MED ORDER — SIMETHICONE 80 MG PO CHEW
CHEWABLE_TABLET | ORAL | Status: AC
  Filled 2022-12-14: qty 1

## 2022-12-14 MED ORDER — HYDROCODONE-ACETAMINOPHEN 5-325 MG PO TABS
ORAL_TABLET | ORAL | Status: AC
  Filled 2022-12-14: qty 2

## 2022-12-14 MED ORDER — KETOROLAC TROMETHAMINE 30 MG/ML IJ SOLN
INTRAMUSCULAR | Status: AC
  Filled 2022-12-14: qty 1

## 2022-12-14 NOTE — Discharge Summary (Signed)
Physician Discharge Summary  Patient ID: Bonnie Friedman MRN: [id number] DOB/AGE: 06-15-1990 32 y.o.  Admit date: 12/13/2022 Discharge date: 12/14/2022  Admission Diagnoses:  Discharge Diagnoses:  Principal Problem:   Fibroid   Discharged Condition: good  Hospital Course: 32 yo admitted for scheduled above surgery. She Had an uncomplicated abdominal MMY. She had an uncomplicated postoperative course and on POD#1 was meeting all goals such as ambulating, passing flatus, voiding freely, and tolerating a general diet and she was deemed stable for discharge home.    Consults: None  Significant Diagnostic Studies: labs: CBC    Component Value Date/Time   WBC 13.4 (H) 12/14/2022 0327   RBC 3.89 12/14/2022 0327   HGB 11.5 (L) 12/14/2022 0327   HCT 35.2 (L) 12/14/2022 0327   PLT 268 12/14/2022 0327   MCV 90.5 12/14/2022 0327   MCH 29.6 12/14/2022 0327   MCHC 32.7 12/14/2022 0327   RDW 12.7 12/14/2022 0327     Treatments: surgery: Abdominal MMY  Discharge Exam: Blood pressure 117/84, pulse 86, temperature 98.3 F (36.8 C), resp. rate 16, height 5\' 5"  (1.651 m), weight 80.3 kg, last menstrual period 12/13/2022, SpO2 100%, not currently breastfeeding. General appearance: alert and cooperative Resp: NWOB Cardio: regular rate and rhythm GI: soft, non-tender; bowel sounds normal; no masses,  no organomegaly Extremities: extremities normal, atraumatic, no cyanosis or edema Incision/Wound: C/d/i , minimal bleeding  Disposition: Discharge disposition: 01-Home or Self Care       Discharge Instructions     Call MD for:   Complete by: As directed    Heavy vaginal bleeding or abnormal vaginal discharge   Call MD for:  difficulty breathing, headache or visual disturbances   Complete by: As directed    Call MD for:  persistant nausea and vomiting   Complete by: As directed    Call MD for:  redness, tenderness, or signs of infection (pain, swelling, redness, odor or green/yellow  discharge around incision site)   Complete by: As directed    Call MD for:  severe uncontrolled pain   Complete by: As directed    Call MD for:  temperature >100.4   Complete by: As directed    Diet general   Complete by: As directed    Driving Restrictions   Complete by: As directed    Do not drive until you are off narcotic pain medications and you feel like you can react in an emergency.   Increase activity slowly   Complete by: As directed    Leave dressing on - Keep it clean, dry, and intact until clinic visit   Complete by: As directed    Lifting restrictions   Complete by: As directed    Don't lift anything more than 15-20 pounds   Sexual Activity Restrictions   Complete by: As directed    Nothing in the vagina x 2 to 6 weeks. We will discuss at clinic visit.      Allergies as of 12/14/2022       Reactions   Penicillins Itching        Medication List     TAKE these medications    norgestrel-ethinyl estradiol 0.3-30 MG-MCG tablet Commonly known as: LO/OVRAL Take 1 tablet by mouth daily.   valACYclovir 500 MG tablet Commonly known as: VALTREX Take 500 mg by mouth 2 (two) times daily.               Discharge Care Instructions  (From admission, onward)  Start     Ordered   12/14/22 0000  Leave dressing on - Keep it clean, dry, and intact until clinic visit        12/14/22 0806            CONTINUE  Ibuprofen 600mg  every 6 hours Norco 1 tablet every 6 hours DO not exceed more than 4000mg  of tylenol in 24 hours.    Signed: Ranae Pila 12/14/2022, 8:16 AM

## 2022-12-14 NOTE — Progress Notes (Signed)
1 Day Post-Op Procedure(s) (LRB): ABDOMINAL MYOMECTOMY (N/A)  Subjective: Patient reports no problems voiding.  Ambulating in the halls. Pain well controlled. Passing flatus. No N/V. Tolerating general diet.   Objective: I have reviewed patient's vital signs, intake and output, and medications.  General: alert and cooperative Resp: NWOB Cardio: regular rate and rhythm GI: soft, non-tender; bowel sounds normal; no masses,  no organomegaly and incision: clean, dry, and intact Extremities: extremities normal, atraumatic, no cyanosis or edema and Homans sign is negative, no sign of DVT Vaginal Bleeding: minimal  Assessment: s/p Procedure(s): ABDOMINAL MYOMECTOMY (N/A): stable and progressing well  Plan: Encourage ambulation Discharge home She is doing well. I d/w pt surgical findings .    LOS: 0 days    Ranae Pila, MD 12/14/2022, 8:06 AM

## 2023-01-22 ENCOUNTER — Encounter (HOSPITAL_COMMUNITY): Payer: Self-pay

## 2023-01-22 ENCOUNTER — Emergency Department (HOSPITAL_COMMUNITY): Payer: Medicaid Other

## 2023-01-22 ENCOUNTER — Other Ambulatory Visit: Payer: Self-pay

## 2023-01-22 ENCOUNTER — Emergency Department (HOSPITAL_COMMUNITY)
Admission: EM | Admit: 2023-01-22 | Discharge: 2023-01-22 | Disposition: A | Discharge status: Home / Self Care | Payer: Medicaid Other | Attending: Emergency Medicine | Admitting: Emergency Medicine

## 2023-01-22 DIAGNOSIS — R079 Chest pain, unspecified: Secondary | ICD-10-CM | POA: Insufficient documentation

## 2023-01-22 DIAGNOSIS — R0602 Shortness of breath: Secondary | ICD-10-CM | POA: Diagnosis not present

## 2023-01-22 LAB — CBC
HCT: 38.4 % (ref 36.0–46.0)
Hemoglobin: 12.7 g/dL (ref 12.0–15.0)
MCH: 29.3 pg (ref 26.0–34.0)
MCHC: 33.1 g/dL (ref 30.0–36.0)
MCV: 88.7 fL (ref 80.0–100.0)
Platelets: 327 10*3/uL (ref 150–400)
RBC: 4.33 MIL/uL (ref 3.87–5.11)
RDW: 12.6 % (ref 11.5–15.5)
WBC: 7.8 10*3/uL (ref 4.0–10.5)
nRBC: 0 % (ref 0.0–0.2)

## 2023-01-22 LAB — BASIC METABOLIC PANEL
Anion gap: 13 (ref 5–15)
BUN: 11 mg/dL (ref 6–20)
CO2: 22 mmol/L (ref 22–32)
Calcium: 9.5 mg/dL (ref 8.9–10.3)
Chloride: 102 mmol/L (ref 98–111)
Creatinine, Ser: 0.8 mg/dL (ref 0.44–1.00)
GFR, Estimated: >60 mL/min (ref >60)
Glucose, Bld: 127 mg/dL — ABNORMAL HIGH (ref 70–99)
Potassium: 3.5 mmol/L (ref 3.5–5.1)
Sodium: 137 mmol/L (ref 135–145)

## 2023-01-22 LAB — D-DIMER, QUANTITATIVE: D-Dimer, Quant: <0.27 ug{FEU}/mL (ref 0.00–0.50)

## 2023-01-22 LAB — TROPONIN I (HIGH SENSITIVITY)
Troponin I (High Sensitivity): <2 ng/L (ref <18)
Troponin I (High Sensitivity): <2 ng/L (ref <18)

## 2023-01-22 LAB — HCG, SERUM, QUALITATIVE: Preg, Serum: NEGATIVE

## 2023-01-22 NOTE — ED Triage Notes (Signed)
Patient reports chest pain on the left side with shortness of breath, that started 3 hours ago. Denies blood thinners. Pain does not radiate anywhere. Patient has unlabored, regular breathing in triage. Chest pain is rated 5/10.

## 2023-01-22 NOTE — ED Provider Notes (Signed)
[place] EMERGENCY DEPARTMENT AT [facility] Provider Note   CSN: [id number] Arrival date & time: 01/22/23  4696     History  Chief Complaint  Patient presents with   Chest Pain    Bonnie Friedman is a 32 y.o. female presented to ED with complaint of chest pain.  Patient reports she developed left-sided chest pain spontaneously last night, that is behind her left breast, has been waxing and waning but never going away for the past several hours.  She has never had this pain before.  It does not radiate anywhere.  She reports a very mild shortness of breath.  She denies any recent fevers, chills, infectious symptoms.  She report several weeks ago she had a uterine fibroid surgery performed.  Otherwise she does not have any significant medical problems.  She is on an estrogen containing hormone medication.  She denies any unilateral leg swelling, history of DVT or PE.  HPI     Home Medications Prior to Admission medications   Medication Sig Start Date End Date Taking? Authorizing Provider  norgestrel-ethinyl estradiol (LO/OVRAL) 0.3-30 MG-MCG tablet Take 1 tablet by mouth daily.    [provider]  valACYclovir (VALTREX) 500 MG tablet Take 500 mg by mouth 2 (two) times daily.    [provider]      Allergies    Penicillins    Review of Systems   Review of Systems  Physical Exam Updated Vital Signs BP (!) 141/80   Pulse 89   Temp 98.7 F (37.1 C) (Oral)   Resp 18   Ht 5\' 6"  (1.676 m)   Wt 81.6 kg   LMP 01/15/2023 (Exact Date)   SpO2 100%   BMI 29.05 kg/m  Physical Exam Constitutional:      General: She is not in acute distress. HENT:     Head: Normocephalic and atraumatic.  Eyes:     Conjunctiva/sclera: Conjunctivae normal.     Pupils: Pupils are equal, round, and reactive to light.  Cardiovascular:     Rate and Rhythm: Normal rate and regular rhythm.  Pulmonary:     Effort: Pulmonary effort is normal. No respiratory distress.   Abdominal:     General: There is no distension.     Tenderness: There is no abdominal tenderness.  Skin:    General: Skin is warm and dry.  Neurological:     General: No focal deficit present.     Mental Status: She is alert. Mental status is at baseline.  Psychiatric:        Mood and Affect: Mood normal.        Behavior: Behavior normal.     ED Results / Procedures / Treatments   Labs (all labs ordered are listed, but only abnormal results are displayed) Labs Reviewed  BASIC METABOLIC PANEL - Abnormal; Notable for the following components:      Result Value   Glucose, Bld 127 (*)    All other components within normal limits  CBC  HCG, SERUM, QUALITATIVE  D-DIMER, QUANTITATIVE  TROPONIN I (HIGH SENSITIVITY)  TROPONIN I (HIGH SENSITIVITY)    EKG EKG Interpretation Date/Time:  Sunday January 22 2023 05:36:54 EDT Ventricular Rate:  107 PR Interval:  134 QRS Duration:  78 QT Interval:  340 QTC Calculation: 453 R Axis:   79  Text Interpretation: Sinus tachycardia Nonspecific T wave abnormality No previous ECGs available Confirmed by Alvester Chou [phone]) on 01/22/2023 7:49:14 AM  Radiology DG Chest 2 View  Result Date: 01/22/2023 CLINICAL DATA:  Chest pain EXAM: CHEST - 2 VIEW COMPARISON:  None Available. FINDINGS: The heart size and mediastinal contours are within normal limits. Both lungs are clear. The visualized skeletal structures are unremarkable. IMPRESSION: No active cardiopulmonary disease. Electronically Signed   By: Malachy Moan M.D.   On: 01/22/2023 07:52    Procedures Procedures    Medications Ordered in ED Medications - No data to display  ED Course/ Medical Decision Making/ A&P Clinical Course as of 01/22/23 0915  Sun Jan 22, 2023  0903 D-Dimer, Quant: <0.27 [MT]    Clinical Course User Index [MT] Terald Sleeper, MD                                 Medical Decision Making Amount and/or Complexity of Data Reviewed Labs: ordered.  Decision-making details documented in ED Course. Radiology: ordered.   This patient presents to the ED with concern for chest pain. This involves an extensive number of treatment options, and is a complaint that carries with it a high risk of complications and morbidity.  The differential diagnosis includes musculoskeletal chest wall pain versus lymphadenopathy versus gastritis versus pneumonia versus pneumothorax versus PE versus other  Co-morbidities that complicate the patient evaluation: History of estrogen hormone use at high risk of DVT/PE  I ordered and personally interpreted labs.  The pertinent results include: No emergent findings, troponin undetectable.  D-dimer negative.  No leukocytosis.  I ordered imaging studies including x-ray of the chest I independently visualized and interpreted imaging which showed no emergent findings I agree with the radiologist interpretation  The patient was maintained on a cardiac monitor.  I personally viewed and interpreted the cardiac monitored which showed an underlying rhythm of: Sinus rhythm  Per my interpretation the patient's ECG shows no acute ischemic findings  I have reviewed the patients home medicines and have made adjustments as needed  Test Considered: With a negative D-dimer I have a lower clinical suspicion for acute PE and do not feel a CT angiogram is indicated at this time.  After the interventions noted above, I reevaluated the patient and found that they have: stayed the same   Dispostion:  After consideration of the diagnostic results and the patients response to treatment, I feel that the patent would benefit from outpatient follow-up with PCP.  Doubt aortic dissection, ACS, PE, PNA, sepsis, mastitis, or intraabdominal or pelvic emergent pathology with this presentation. Patient is stable for discharge.         Final Clinical Impression(s) / ED Diagnoses Final diagnoses:  Chest pain, unspecified type    Rx /  DC Orders ED Discharge Orders     None         Terald Sleeper, MD 01/22/23 [id number]

## 2023-04-02 ENCOUNTER — Other Ambulatory Visit: Payer: Self-pay

## 2023-04-02 ENCOUNTER — Emergency Department (HOSPITAL_COMMUNITY)
Admission: EM | Admit: 2023-04-02 | Discharge: 2023-04-02 | Disposition: A | Discharge status: Home / Self Care | Payer: Managed Care, Other (non HMO) | Attending: Emergency Medicine | Admitting: Emergency Medicine

## 2023-04-02 ENCOUNTER — Encounter (HOSPITAL_COMMUNITY): Payer: Self-pay

## 2023-04-02 DIAGNOSIS — R112 Nausea with vomiting, unspecified: Secondary | ICD-10-CM | POA: Insufficient documentation

## 2023-04-02 DIAGNOSIS — R1013 Epigastric pain: Secondary | ICD-10-CM | POA: Diagnosis not present

## 2023-04-02 DIAGNOSIS — R000 Tachycardia, unspecified: Secondary | ICD-10-CM | POA: Diagnosis not present

## 2023-04-02 DIAGNOSIS — R197 Diarrhea, unspecified: Secondary | ICD-10-CM | POA: Insufficient documentation

## 2023-04-02 LAB — CBC WITH DIFFERENTIAL/PLATELET
Abs Immature Granulocytes: 0.05 10*3/uL (ref 0.00–0.07)
Basophils Absolute: 0 10*3/uL (ref 0.0–0.1)
Basophils Relative: 0 %
Eosinophils Absolute: 0 10*3/uL (ref 0.0–0.5)
Eosinophils Relative: 0 %
HCT: 42.4 % (ref 36.0–46.0)
Hemoglobin: 14.1 g/dL (ref 12.0–15.0)
Immature Granulocytes: 1 %
Lymphocytes Relative: 11 %
Lymphs Abs: 0.9 10*3/uL (ref 0.7–4.0)
MCH: 30.3 pg (ref 26.0–34.0)
MCHC: 33.3 g/dL (ref 30.0–36.0)
MCV: 91.2 fL (ref 80.0–100.0)
Monocytes Absolute: 0.3 10*3/uL (ref 0.1–1.0)
Monocytes Relative: 3 %
Neutro Abs: 7.5 10*3/uL (ref 1.7–7.7)
Neutrophils Relative %: 85 %
Platelets: 290 10*3/uL (ref 150–400)
RBC: 4.65 MIL/uL (ref 3.87–5.11)
RDW: 12.8 % (ref 11.5–15.5)
WBC: 8.7 10*3/uL (ref 4.0–10.5)
nRBC: 0 % (ref 0.0–0.2)

## 2023-04-02 LAB — COMPREHENSIVE METABOLIC PANEL
ALT: 20 U/L (ref 0–44)
AST: 17 U/L (ref 15–41)
Albumin: 4.4 g/dL (ref 3.5–5.0)
Alkaline Phosphatase: 35 U/L — ABNORMAL LOW (ref 38–126)
Anion gap: 9 (ref 5–15)
BUN: 13 mg/dL (ref 6–20)
CO2: 22 mmol/L (ref 22–32)
Calcium: 9.3 mg/dL (ref 8.9–10.3)
Chloride: 107 mmol/L (ref 98–111)
Creatinine, Ser: 0.67 mg/dL (ref 0.44–1.00)
GFR, Estimated: >60 mL/min (ref >60)
Glucose, Bld: 98 mg/dL (ref 70–99)
Potassium: 3.5 mmol/L (ref 3.5–5.1)
Sodium: 138 mmol/L (ref 135–145)
Total Bilirubin: 1.1 mg/dL (ref <1.2)
Total Protein: 8.3 g/dL — ABNORMAL HIGH (ref 6.5–8.1)

## 2023-04-02 LAB — HCG, SERUM, QUALITATIVE: Preg, Serum: NEGATIVE

## 2023-04-02 LAB — LIPASE, BLOOD: Lipase: 26 U/L (ref 11–51)

## 2023-04-02 MED ORDER — SODIUM CHLORIDE 0.9 % IV BOLUS
1000.0000 mL | Freq: Once | INTRAVENOUS | Status: AC
  Administered 2023-04-02: 1000 mL via INTRAVENOUS

## 2023-04-02 MED ORDER — ONDANSETRON HCL 4 MG/2ML IJ SOLN
4.0000 mg | Freq: Once | INTRAMUSCULAR | Status: AC
  Administered 2023-04-02: 4 mg via INTRAVENOUS
  Filled 2023-04-02: qty 2

## 2023-04-02 MED ORDER — ONDANSETRON 4 MG PO TBDP: 4.0000 mg | ORAL_TABLET | Freq: Three times a day (TID) | ORAL | 0 refills | Status: AC | PRN

## 2023-04-02 NOTE — ED Triage Notes (Signed)
Patient has had nausea, vomiting, diarrhea since this morning. Any time she takes a sip, she has to vomit. Sharp epigastric abdominal pain.

## 2023-04-02 NOTE — ED Provider Notes (Signed)
[place] EMERGENCY DEPARTMENT AT [facility] Provider Note   CSN: [id number] Arrival date & time: 04/02/23  1331     History  Chief Complaint  Patient presents with   Emesis    Bonnie Friedman is a 32 y.o. female.  Patient with history of fibroid surgery presents to the emergency department today for evaluation of nausea, vomiting and diarrhea.  Symptoms started this morning.  She developed several hours of watery, nonbloody diarrhea.  A couple of hours later she developed nausea and vomiting and was unable to keep down any fluids.  She has had some epigastric pain with this as well.  Her 25-year-old son had several episodes of vomiting yesterday which resolved.  Son did not have diarrhea.  Patient denies any urinary complaints.  She does report not having a period in the past 2 months, since her fibroid surgery.  She denies any suspicious food or water exposures.  No recent antibiotic use.       Home Medications Prior to Admission medications   Medication Sig Start Date End Date Taking? Authorizing Provider  norgestrel-ethinyl estradiol (LO/OVRAL) 0.3-30 MG-MCG tablet Take 1 tablet by mouth daily.    [provider]  valACYclovir (VALTREX) 500 MG tablet Take 500 mg by mouth 2 (two) times daily.    [provider]      Allergies    Penicillins    Review of Systems   Review of Systems  Physical Exam Updated Vital Signs BP (!) 135/94 (BP Location: Right Arm)   Pulse (!) 115   Temp 98.4 F (36.9 C) (Oral)   Resp 18   Ht 5\' 6"  (1.676 m)   Wt 81.6 kg   SpO2 98%   BMI 29.05 kg/m   Physical Exam Vitals and nursing note reviewed.  Constitutional:      General: She is not in acute distress.    Appearance: She is well-developed.  HENT:     Head: Normocephalic and atraumatic.     Right Ear: External ear normal.     Left Ear: External ear normal.     Nose: Nose normal.  Eyes:     Conjunctiva/sclera: Conjunctivae normal.  Cardiovascular:      Rate and Rhythm: Regular rhythm. Tachycardia present.     Heart sounds: No murmur heard. Pulmonary:     Effort: No respiratory distress.     Breath sounds: No wheezing, rhonchi or rales.  Abdominal:     Palpations: Abdomen is soft.     Tenderness: There is abdominal tenderness. There is no guarding or rebound.     Comments: Mild epigastric tenderness to palpation, no rebound or guarding.  Musculoskeletal:     Cervical back: Normal range of motion and neck supple.     Right lower leg: No edema.     Left lower leg: No edema.  Skin:    General: Skin is warm and dry.     Findings: No rash.  Neurological:     General: No focal deficit present.     Mental Status: She is alert. Mental status is at baseline.     Motor: No weakness.  Psychiatric:        Mood and Affect: Mood normal.     ED Results / Procedures / Treatments   Labs (all labs ordered are listed, but only abnormal results are displayed) Labs Reviewed  COMPREHENSIVE METABOLIC PANEL - Abnormal; Notable for the following components:      Result Value   Total  Protein 8.3 (*)    Alkaline Phosphatase 35 (*)    All other components within normal limits  CBC WITH DIFFERENTIAL/PLATELET  LIPASE, BLOOD  HCG, SERUM, QUALITATIVE    EKG None  Radiology No results found.  Procedures Procedures    Medications Ordered in ED Medications  sodium chloride 0.9 % bolus 1,000 mL (0 mLs Intravenous Stopped 04/02/23 1527)  ondansetron (ZOFRAN) injection 4 mg (4 mg Intravenous Given 04/02/23 1413)    ED Course/ Medical Decision Making/ A&P    Patient seen and examined. History obtained directly from patient.   Labs/EKG: Ordered CBC, CMP, lipase, UA, pregnancy.  Imaging: None ordered  Medications/Fluids: Ordered Zofran, IV fluid bolus  Most recent vital signs reviewed and are as follows: BP (!) 135/94 (BP Location: Right Arm)   Pulse (!) 115   Temp 98.4 F (36.9 C) (Oral)   Resp 18   Ht 5\' 6"  (1.676 m)   Wt 81.6  kg   SpO2 98%   BMI 29.05 kg/m   Initial impression: Nausea, vomiting, diarrhea consistent with gastroenteritis, reassuring exam.  Awaiting labs and symptom control.  3:33 PM Reassessment performed. Patient appears comfortable, stable.  She states that she is feeling a bit better.  Nausea is improved.  Still with some mild epigastric pain.  I gave her some ginger ale which she has been tolerating.  Labs personally reviewed and interpreted including: CBC with normal white blood cell count and hemoglobin; CMP with normal transaminases and kidney function, glucose, and electrolytes; lipase normal without signs of pancreatitis; pregnancy negative.  Reviewed pertinent lab work and imaging with patient at bedside. Questions answered.   Most current vital signs reviewed and are as follows: BP (!) 135/94 (BP Location: Right Arm)   Pulse (!) 115   Temp 98.4 F (36.9 C) (Oral)   Resp 18   Ht 5\' 6"  (1.676 m)   Wt 81.6 kg   SpO2 98%   BMI 29.05 kg/m   Plan: Discharge to home.   Prescriptions written for: Zofran  Other home care instructions discussed: Clear liquid with slow transition to bland diet over the next 12 hours.  Discussed brat diet.  ED return instructions discussed: The patient was urged to return to the Emergency Department immediately with worsening of current symptoms, worsening abdominal pain, persistent vomiting, blood noted in stools, fever, or any other concerns. The patient verbalized understanding.   Follow-up instructions discussed: Patient encouraged to follow-up with their PCP in 2-3 days if not improving.                                  Medical Decision Making Amount and/or Complexity of Data Reviewed Labs: ordered.  Risk Prescription drug management.   For this patient's complaint of abdominal pain, N/V/D, the following conditions were considered on the differential diagnosis: gastritis/PUD, enteritis/duodenitis, appendicitis,  cholelithiasis/cholecystitis, cholangitis, pancreatitis, ruptured viscus, colitis, diverticulitis, small/large bowel obstruction, proctitis, cystitis, pyelonephritis, ureteral colic, aortic dissection, aortic aneurysm. In women, ectopic pregnancy, pelvic inflammatory disease, ovarian cysts, and tubo-ovarian abscess were also considered. Atypical chest etiologies were also considered including ACS, PE, and pneumonia.  Exam is reassuring and lab workup is reassuring.  Patient not tolerating oral fluids.  Feels better after IV hydration and Zofran.  No indication for advanced imaging at this time.  Currently symptoms are following the expected pattern for gastroenteritis.  The patient's vital signs, pertinent lab work and imaging were reviewed  and interpreted as discussed in the ED course. Hospitalization was considered for further testing, treatments, or serial exams/observation. However as patient is well-appearing, has a stable exam, and reassuring studies today, I do not feel that they warrant admission at this time. This plan was discussed with the patient who verbalizes agreement and comfort with this plan and seems reliable and able to return to the Emergency Department with worsening or changing symptoms.            Final Clinical Impression(s) / ED Diagnoses Final diagnoses:  Nausea vomiting and diarrhea    Rx / DC Orders ED Discharge Orders          Ordered    ondansetron (ZOFRAN-ODT) 4 MG disintegrating tablet  Every 8 hours PRN        04/02/23 1519              Renne Crigler, PA-C 04/02/23 1535    Wynetta Fines, MD 04/03/23 1353

## 2023-04-02 NOTE — ED Notes (Addendum)
RN informed pt. Urine is needed clear understanding voiced by patient

## 2023-04-02 NOTE — Discharge Instructions (Signed)
Please read and follow all provided instructions.  Your diagnoses today include:  1. Nausea vomiting and diarrhea     TTests performed today include: Blood cell counts and platelets Kidney and liver function tests Pancreas function test (called lipase) A blood or urine test for pregnancy (women only): was negative Vital signs. See below for your results today.   Medications prescribed:  Zofran (ondansetron) - for nausea and vomiting  Take any prescribed medications only as directed.  Home care instructions:  Follow any educational materials contained in this packet.  Your abdominal pain, nausea, vomiting, and diarrhea may be caused by a viral gastroenteritis also called 'stomach flu'. You should rest for the next several days. Keep drinking plenty of fluids and use the medicine for nausea as directed.   Drink clear liquids for the next 24 hours and introduce solid foods slowly after 24 hours using the b.r.a.t. diet (Bananas, Rice, Applesauce, Toast, Yogurt).    Follow-up instructions: Please follow-up with your primary care provider in the next 2 days for further evaluation of your symptoms. If you are not feeling better in 48 hours you may have a condition that is more serious and you need re-evaluation.   Return instructions:  SEEK IMMEDIATE MEDICAL ATTENTION IF: If you have pain that does not go away or becomes severe  A temperature above 101F develops  Repeated vomiting occurs (multiple episodes)  If you have pain that becomes localized to portions of the abdomen. The right side could possibly be appendicitis. In an adult, the left lower portion of the abdomen could be colitis or diverticulitis.  Blood is being passed in stools or vomit (bright red or black tarry stools)  You develop chest pain, difficulty breathing, dizziness or fainting, or become confused, poorly responsive, or inconsolable (young children) If you have any other emergent concerns regarding your  health  Additional Information: Abdominal (belly) pain can be caused by many things. Your caregiver performed an examination and possibly ordered blood/urine tests and imaging (CT scan, x-rays, ultrasound). Many cases can be observed and treated at home after initial evaluation in the emergency department. Even though you are being discharged home, abdominal pain can be unpredictable. Therefore, you need a repeated exam if your pain does not resolve, returns, or worsens. Most patients with abdominal pain don't have to be admitted to the hospital or have surgery, but serious problems like appendicitis and gallbladder attacks can start out as nonspecific pain. Many abdominal conditions cannot be diagnosed in one visit, so follow-up evaluations are very important.  Your vital signs today were: BP (!) 135/94 (BP Location: Right Arm)   Pulse (!) 115   Temp 98.4 F (36.9 C) (Oral)   Resp 18   Ht 5\' 6"  (1.676 m)   Wt 81.6 kg   SpO2 98%   BMI 29.05 kg/m  If your blood pressure (bp) was elevated above 135/85 this visit, please have this repeated by your doctor within one month. --------------

## 2023-09-21 ENCOUNTER — Encounter: Payer: Self-pay | Admitting: Neurology

## 2023-11-20 ENCOUNTER — Ambulatory Visit (INDEPENDENT_AMBULATORY_CARE_PROVIDER_SITE_OTHER): Admitting: Neurology

## 2023-11-20 ENCOUNTER — Encounter: Payer: Self-pay | Admitting: Neurology

## 2023-11-20 VITALS — BP 120/84 | HR 88 | Ht 66.0 in | Wt 181.0 lb

## 2023-11-20 DIAGNOSIS — R202 Paresthesia of skin: Secondary | ICD-10-CM

## 2023-11-20 DIAGNOSIS — R200 Anesthesia of skin: Secondary | ICD-10-CM | POA: Diagnosis not present

## 2023-11-20 NOTE — Progress Notes (Signed)
 [facility] Clinic Note - Initial Visit   Date: 11/20/2023   Bonnie Friedman MRN: [id number] DOB: Apr 05, 1991   Dear Dr. Marne:  Thank you for your kind referral of Bonnie Friedman for consultation of numbness/tingling. Although her history is well known to you, please allow us  to reiterate it for the purpose of our medical record. The patient was accompanied to the clinic by self.    Bonnie Friedman is a 33 y.o. right-handed female with prediabetes presenting for evaluation of numbness/tingling.   IMPRESSION/PLAN: Paresthesias of the feet, most likely side effect of metformin as symptoms have improved with reduction in dose of metformin. Vitamin B12 is normal. Neurological exam is normal.  Reassurance was provided that I did not appreciate any abnormalities to suggest any worrisome pathology.  She is very concerned about multiple sclerosis as her father and paternal grandmother have MS, so would like to get imaging.  MRI brain wwo contrast will be ordered.  Further recommendations pending results.   ------------------------------------------------------------- History of present illness: Starting in March 2025, she began having tingling involving the left hand which prompted her to go to the ER.  It resolved within a day and did not recur.  No associated weakness or neck pain.   In April, she began having numbness involving the entire feet which was intermittent throughout the day, which was more constant in May/June.  No weakness or imbalance in the legs.  She takes metformin for prediabetes and dose was increased to 500mg  twice daily around when her symptoms started to become more bothersome.  She reduced her metformin 500mg  daily and has noticed that her symptoms much less frequency, now occurring about once per week lasting 5-10 minute and recur later in the day.    Her father and paternal grandmother have multiple sclerosis and became very concerned about  this.   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   Out-side paper records, electronic medical record, and images have been reviewed where available and summarized as:  Labs 09/19/2023:  Vitamin B12 391   Past Medical History:  Diagnosis Date   Anxiety    no meds   Genital herpes    PONV (postoperative nausea and vomiting)    Uterine fibroid    Wears contact lenses    Wears glasses     Past Surgical History:  Procedure Laterality Date   CESAREAN SECTION N/A 03/01/2022   Procedure: PRIMARY CESAREAN SECTION  EDC: 03-12-22 ALLERG: PENICILLINS;  Surgeon: Mat Browning, MD;  Location: [facility];  Service: Obstetrics;  Laterality: N/A;   KNEE ARTHROSCOPY Left 06/2020   MYOMECTOMY N/A 12/13/2022   Procedure: ABDOMINAL MYOMECTOMY;  Surgeon: Marne Kelly Nest, MD;  Location: [facility];  Service: Gynecology;  Laterality: N/A;   TYMPANOSTOMY TUBE PLACEMENT     as a child     Medications:  Outpatient Encounter Medications as of 11/20/2023  Medication Sig   ESTARYLLA 0.25-35 MG-MCG tablet Take 1 tablet by mouth daily.   metFORMIN (GLUCOPHAGE-XR) 500 MG 24 hr tablet Take 1,000 mg by mouth daily.   ondansetron  (ZOFRAN -ODT) 4 MG disintegrating tablet Take 1 tablet (4 mg total) by mouth every 8 (eight) hours as needed for nausea or vomiting.   valACYclovir  (VALTREX ) 500 MG tablet Take 500 mg by mouth 2 (two) times daily.   norgestrel-ethinyl estradiol (LO/OVRAL) 0.3-30 MG-MCG tablet Take 1 tablet by mouth daily. (Patient not taking: Reported on 11/20/2023)   No facility-administered  encounter medications on file as of 11/20/2023.    Allergies:  Allergies  Allergen Reactions   Penicillins Itching    Family History: Family History  Problem Relation Age of Onset   Diabetes Mother        Type II   Other Mother        Shoulder Issues   Stroke Father    Multiple sclerosis Father    Diabetes Maternal Grandmother    Multiple  sclerosis Paternal Grandmother     Social History: Social History   Tobacco Use   Smoking status: Never   Smokeless tobacco: Never  Vaping Use   Vaping status: Never Used  Substance Use Topics   Alcohol use: Yes    Alcohol/week: 2.0 standard drinks of alcohol    Types: 2 Glasses of wine per week    Comment: Occasional drink   Drug use: Never   Social History   Social History Narrative   Are you right handed or left handed? Right Handed    Are you currently employed ? Yes    What is your current occupation?   Do you live at home alone? No    Who lives with you? Son and boyfriend    What type of home do you live in: 1 story or 2 story? Two story home        Vital Signs:  BP 120/84   Pulse 88   Ht 5' 6 (1.676 m)   Wt 181 lb (82.1 kg)   SpO2 100%   BMI 29.21 kg/m   Neurological Exam: MENTAL STATUS including orientation to time, place, person, recent and remote memory, attention span and concentration, language, and fund of knowledge is normal.  Speech is not dysarthric.  CRANIAL NERVES: II:  No visual field defects.     III-IV-VI: Pupils equal round and reactive to light.  Normal conjugate, extra-ocular eye movements in all directions of gaze.  No nystagmus.  No ptosis.   V:  Normal facial sensation.    VII:  Normal facial symmetry and movements.   VIII:  Normal hearing and vestibular function.   IX-X:  Normal palatal movement.   XI:  Normal shoulder shrug and head rotation.   XII:  Normal tongue strength and range of motion, no deviation or fasciculation.  MOTOR:  No atrophy, fasciculations or abnormal movements.  No pronator drift.   Upper Extremity:  Right  Left  Deltoid  5/5   5/5   Biceps  5/5   5/5   Triceps  5/5   5/5   Wrist extensors  5/5   5/5   Wrist flexors  5/5   5/5   Finger extensors  5/5   5/5   Finger flexors  5/5   5/5   Dorsal interossei  5/5   5/5   Abductor pollicis  5/5   5/5   Tone (Ashworth scale)  0  0   Lower Extremity:  Right   Left  Hip flexors  5/5   5/5   Knee flexors  5/5   5/5   Knee extensors  5/5   5/5   Dorsiflexors  5/5   5/5   Plantarflexors  5/5   5/5   Toe extensors  5/5   5/5   Toe flexors  5/5   5/5   Tone (Ashworth scale)  0  0   MSRs:  Right        Left brachioradialis 2+  2+  biceps 2+  2+  triceps 2+  2+  patellar 2+  2+  ankle jerk 2+  2+  Hoffman no  no  plantar response down  down   SENSORY:  Normal and symmetric perception of light touch, pinprick, vibration, and temperature.  Tine's sign at the wrists is absent.   COORDINATION/GAIT: Normal finger-to- nose-finger.  Intact rapid alternating movements bilaterally.   Gait narrow based and stable. Tandem and stressed gait intact.     Thank you for allowing me to participate in patient's care.  If I can answer any additional questions, I would be pleased to do so.    Sincerely,    Iana Buzan K. Tobie, DO

## 2023-11-30 ENCOUNTER — Other Ambulatory Visit
